# Patient Record
Sex: Male | Born: 1970 | Hispanic: No | Marital: Married | Smoking: Never smoker
Health system: Southern US, Community
[De-identification: ages and names within clinical notes are randomized; demographics above are authoritative.]

## PROBLEM LIST (undated history)

## (undated) DIAGNOSIS — I639 Cerebral infarction, unspecified: Secondary | ICD-10-CM

## (undated) DIAGNOSIS — Q2112 Patent foramen ovale: Secondary | ICD-10-CM

## (undated) DIAGNOSIS — D72829 Elevated white blood cell count, unspecified: Secondary | ICD-10-CM

## (undated) DIAGNOSIS — Q211 Atrial septal defect: Secondary | ICD-10-CM

## (undated) DIAGNOSIS — Z789 Other specified health status: Secondary | ICD-10-CM

## (undated) HISTORY — DX: Patent foramen ovale: Q21.12

## (undated) HISTORY — DX: Elevated white blood cell count, unspecified: D72.829

## (undated) HISTORY — PX: NO PAST SURGERIES: SHX2092

## (undated) HISTORY — DX: Cerebral infarction, unspecified: I63.9

## (undated) HISTORY — DX: Atrial septal defect: Q21.1

---

## 2015-08-13 ENCOUNTER — Inpatient Hospital Stay (HOSPITAL_COMMUNITY)
Admission: EM | Admit: 2015-08-13 | Discharge: 2015-08-16 | DRG: 065 | Disposition: A | Discharge status: Home / Self Care | Payer: BLUE CROSS/BLUE SHIELD | Attending: Internal Medicine | Admitting: Internal Medicine

## 2015-08-13 ENCOUNTER — Emergency Department (HOSPITAL_COMMUNITY): Payer: BLUE CROSS/BLUE SHIELD

## 2015-08-13 ENCOUNTER — Encounter (HOSPITAL_COMMUNITY): Payer: Self-pay | Admitting: Emergency Medicine

## 2015-08-13 DIAGNOSIS — I639 Cerebral infarction, unspecified: Secondary | ICD-10-CM | POA: Diagnosis not present

## 2015-08-13 DIAGNOSIS — I10 Essential (primary) hypertension: Secondary | ICD-10-CM | POA: Diagnosis present

## 2015-08-13 DIAGNOSIS — I6789 Other cerebrovascular disease: Secondary | ICD-10-CM | POA: Diagnosis not present

## 2015-08-13 DIAGNOSIS — I77810 Thoracic aortic ectasia: Secondary | ICD-10-CM | POA: Diagnosis present

## 2015-08-13 DIAGNOSIS — F1010 Alcohol abuse, uncomplicated: Secondary | ICD-10-CM | POA: Diagnosis present

## 2015-08-13 DIAGNOSIS — Q211 Atrial septal defect: Secondary | ICD-10-CM

## 2015-08-13 DIAGNOSIS — I63411 Cerebral infarction due to embolism of right middle cerebral artery: Secondary | ICD-10-CM | POA: Diagnosis not present

## 2015-08-13 DIAGNOSIS — E785 Hyperlipidemia, unspecified: Secondary | ICD-10-CM | POA: Diagnosis present

## 2015-08-13 DIAGNOSIS — D72829 Elevated white blood cell count, unspecified: Secondary | ICD-10-CM | POA: Diagnosis not present

## 2015-08-13 DIAGNOSIS — G8194 Hemiplegia, unspecified affecting left nondominant side: Secondary | ICD-10-CM | POA: Diagnosis present

## 2015-08-13 DIAGNOSIS — Q2112 Patent foramen ovale: Secondary | ICD-10-CM | POA: Insufficient documentation

## 2015-08-13 DIAGNOSIS — R531 Weakness: Secondary | ICD-10-CM | POA: Diagnosis present

## 2015-08-13 HISTORY — DX: Other specified health status: Z78.9

## 2015-08-13 LAB — I-STAT TROPONIN, ED: Troponin i, poc: 0.01 ng/mL (ref 0.00–0.08)

## 2015-08-13 LAB — COMPREHENSIVE METABOLIC PANEL
ALT: 19 U/L (ref 17–63)
ANION GAP: 9 (ref 5–15)
AST: 24 U/L (ref 15–41)
Albumin: 4.7 g/dL (ref 3.5–5.0)
Alkaline Phosphatase: 90 U/L (ref 38–126)
BILIRUBIN TOTAL: 0.9 mg/dL (ref 0.3–1.2)
BUN: 19 mg/dL (ref 6–20)
CHLORIDE: 105 mmol/L (ref 101–111)
CO2: 26 mmol/L (ref 22–32)
Calcium: 9.9 mg/dL (ref 8.9–10.3)
Creatinine, Ser: 1.02 mg/dL (ref 0.61–1.24)
Glucose, Bld: 116 mg/dL — ABNORMAL HIGH (ref 65–99)
POTASSIUM: 3.9 mmol/L (ref 3.5–5.1)
Sodium: 140 mmol/L (ref 135–145)
TOTAL PROTEIN: 8.3 g/dL — AB (ref 6.5–8.1)

## 2015-08-13 LAB — CBG MONITORING, ED: GLUCOSE-CAPILLARY: 95 mg/dL (ref 65–99)

## 2015-08-13 LAB — CBC WITH DIFFERENTIAL/PLATELET
BASOS ABS: 0 10*3/uL (ref 0.0–0.1)
Basophils Relative: 0 %
EOS PCT: 1 %
Eosinophils Absolute: 0.1 10*3/uL (ref 0.0–0.7)
HEMATOCRIT: 44.3 % (ref 39.0–52.0)
Hemoglobin: 15.3 g/dL (ref 13.0–17.0)
LYMPHS ABS: 1 10*3/uL (ref 0.7–4.0)
LYMPHS PCT: 8 %
MCH: 28.8 pg (ref 26.0–34.0)
MCHC: 34.5 g/dL (ref 30.0–36.0)
MCV: 83.4 fL (ref 78.0–100.0)
MONO ABS: 0.8 10*3/uL (ref 0.1–1.0)
MONOS PCT: 6 %
NEUTROS ABS: 10.2 10*3/uL — AB (ref 1.7–7.7)
Neutrophils Relative %: 85 %
PLATELETS: 265 10*3/uL (ref 150–400)
RBC: 5.31 MIL/uL (ref 4.22–5.81)
RDW: 13.3 % (ref 11.5–15.5)
WBC: 12 10*3/uL — ABNORMAL HIGH (ref 4.0–10.5)

## 2015-08-13 LAB — ETHANOL

## 2015-08-13 MED ORDER — ASPIRIN 325 MG PO TABS
325.0000 mg | ORAL_TABLET | Freq: Once | ORAL | Status: AC
  Administered 2015-08-13: 325 mg via ORAL
  Filled 2015-08-13: qty 1

## 2015-08-13 MED ORDER — ENOXAPARIN SODIUM 40 MG/0.4ML ~~LOC~~ SOLN
40.0000 mg | Freq: Every day | SUBCUTANEOUS | Status: DC
  Administered 2015-08-14 – 2015-08-15 (×2): 40 mg via SUBCUTANEOUS
  Filled 2015-08-13 (×2): qty 0.4

## 2015-08-13 MED ORDER — SODIUM CHLORIDE 0.9 % IV BOLUS (SEPSIS)
1000.0000 mL | Freq: Once | INTRAVENOUS | Status: AC
Start: 2015-08-13 — End: 2015-08-13
  Administered 2015-08-13: 1000 mL via INTRAVENOUS

## 2015-08-13 MED ORDER — SENNOSIDES-DOCUSATE SODIUM 8.6-50 MG PO TABS: 1.0000 | ORAL_TABLET | Freq: Every evening | ORAL | Status: DC | PRN

## 2015-08-13 MED ORDER — STROKE: EARLY STAGES OF RECOVERY BOOK
Freq: Once | Status: AC
  Administered 2015-08-15: 05:00:00

## 2015-08-13 MED ORDER — ALPRAZOLAM 0.5 MG PO TABS
0.5000 mg | ORAL_TABLET | Freq: Once | ORAL | Status: AC
  Administered 2015-08-13: 0.5 mg via ORAL
  Filled 2015-08-13: qty 1

## 2015-08-13 MED ORDER — ASPIRIN EC 81 MG PO TBEC
162.0000 mg | DELAYED_RELEASE_TABLET | Freq: Every day | ORAL | Status: DC
  Administered 2015-08-14 – 2015-08-15 (×2): 162 mg via ORAL
  Filled 2015-08-13 (×2): qty 2

## 2015-08-13 NOTE — ED Notes (Signed)
Pt's family arrived at bedside and pt became very anxious again after wife appeared anxious. Pt reminded to slow breathing.

## 2015-08-13 NOTE — ED Notes (Signed)
Pt to MRI

## 2015-08-13 NOTE — H&P (Signed)
[facility] History and Physical  Mayan Fillers [id number] DOB: [birth year] DOA: 08/13/2015  Referring physician: Shirlyn Goltz, M.D. PCP: Pcp Not In System   Chief Complaint: LUE weakness.  HPI: Aaron [name] is a 45 y.o. male  without any significant past medical history who comes to the emergency department due to above chief complaint since this morning.  Per patient, around 0830, he started noticing some numbness and weakness on his LUE, particularly on the distal forearm and hand associated with dizziness. He states that the symptoms were initially mild. He told told his wife about them, but symptoms seem to be better and she subsequently went to run some errands. The patient states that after that, he felt worsening of his weakness and numbness with intense dizziness and felt like he was about to pass out. So he called EMS and was subsequently brought to the emergency department. He states that since then the symptoms have been intermittent alternating with very mild to mild weakness of his distal LUE. He also states that he has been with increased stress lately and has been drinking 3-4 ounces of alcohol at night. Workup in the emergency department shows small acute CVA in the perirolandic area on MRI.   Review of Systems:  Constitutional:  No weight loss, night sweats, Fevers, chills, fatigue.  HEENT:  No headaches, Difficulty swallowing,Tooth/dental problems,Sore throat,  No sneezing, itching, ear ache, nasal congestion, post nasal drip,  Cardio-vascular:  No chest pain, Orthopnea, PND, swelling in lower extremities, anasarca, palpitations.  GI:  No heartburn, indigestion, abdominal pain, nausea, vomiting, diarrhea, change in bowel habits, loss of appetite  Resp:  No shortness of breath with exertion or at rest. No excess mucus, no productive cough, No non-productive cough, No coughing up of blood.No change in color of mucus.No wheezing.No chest wall deformity  Skin:  no rash  or lesions.  GU:  no dysuria, change in color of urine, no urgency or frequency. No flank pain.  Musculoskeletal:  No joint pain or swelling. No decreased range of motion. No back pain.  Psych:  Increased stress lately.  Neuro:  As above mentioned.   History reviewed. No pertinent past medical history. History reviewed. No pertinent past surgical history. Social History:  reports that he has never smoked. He does not have any smokeless tobacco history on file. He reports that he drinks alcohol. His drug history is not on file.  No Known Allergies  History reviewed. No pertinent family history.  Prior to Admission medications   Not on File   Physical Exam: Filed Vitals:   08/13/15 1359 08/13/15 1403 08/13/15 1824  BP:  121/89 98/66  Pulse:  70   Temp:  98.3 F (36.8 C)   TempSrc:  Oral   Resp:  16   SpO2: 97% 100%     Wt Readings from Last 3 Encounters:  No data found for Wt    General:  Mildly anxious. Eyes: PERRL, normal lids, irises & conjunctiva ENT: grossly normal hearing, lips & tongue Neck: no bruits, LAD, masses or thyromegaly Cardiovascular: RRR, no m/r/g. No LE edema. Telemetry: SR, no arrhythmias  Respiratory: CTA bilaterally, no w/r/r. Normal respiratory effort. Abdomen: soft, ntnd Skin: no rash or induration seen on limited exam Musculoskeletal: grossly normal tone BUE/BLE Psychiatric: grossly normal mood and affect, speech fluent and appropriate Neurologic: Awake, alert, oriented 4, mildly ecreased sensory and mild pronator drift on LUE. The patient had difficulty with nose to finger testing with LUE, Romberg test was  negative.           Labs on Admission:  Basic Metabolic Panel:  Recent Labs Lab 08/13/15 1540  NA 140  K 3.9  CL 105  CO2 26  GLUCOSE 116*  BUN 19  CREATININE 1.02  CALCIUM 9.9   Liver Function Tests:  Recent Labs Lab 08/13/15 1540  AST 24  ALT 19  ALKPHOS 90  BILITOT 0.9  PROT 8.3*  ALBUMIN 4.7    CBC:  Recent Labs Lab 08/13/15 1540  WBC 12.0*  NEUTROABS 10.2*  HGB 15.3  HCT 44.3  MCV 83.4  PLT 265    CBG:  Recent Labs Lab 08/13/15 1423  GLUCAP 95    Radiological Exams on Admission: Mr Brain Wo Contrast  08/13/2015  CLINICAL DATA:  45 year old male with left upper extremity weakness. Tingling in the fingers. Tachypnea. Dizziness. Initial encounter. EXAM: MRI HEAD WITHOUT CONTRAST TECHNIQUE: Multiplanar, multiecho pulse sequences of the brain and surrounding structures were obtained without intravenous contrast. COMPARISON:  None. FINDINGS: Major intracranial vascular flow voids are within normal limits. There is a small focus of cortical restricted diffusion in the right parietal lobe near the lateral peri-Rolandic cortex. There is more subtle cortically based diffusion signal abnormality in the right pre motor area near the left upper extremity motor representation (series 3, image 39). This is associated with subtle cortical FLAIR hyperintensity (series 7, image 20) with no associated hemorrhage or mass effect. No contralateral left hemisphere or posterior fossa restricted diffusion. Elsewhere gray and white matter signal is within normal limits for age throughout the brain. No midline shift, mass effect, evidence of mass lesion, ventriculomegaly, extra-axial collection or acute intracranial hemorrhage. Cervicomedullary junction and pituitary are within normal limits. Negative visualized cervical spine. Normal bone marrow signal. Visible internal auditory structures appear normal. Mastoids are clear. Right maxillary subtotal opacification which appears related to multiple mucous retention cysts. Small retention cyst in the left maxillary sinus. Mild ethmoid sinus mucosal thickening. Negative orbit and scalp soft tissues. IMPRESSION: 1. Small/subtle signal abnormality in the right peri-Rolandic cortex is most compatible with small acute cortical infarcts in the posterior right MCA  territory near the left upper extremity representation area. 2. No associated hemorrhage or mass effect. 3. Elsewhere normal noncontrast MRI appearance of the brain. Electronically Signed   By: Genevie Ann M.D.   On: 08/13/2015 17:32    EKG: Independently reviewed. Vent. rate 62 BPM PR interval 34 ms QRS duration 96 ms QT/QTc 413/419 ms P-R-T axes 44 51 10 Sinus rhythm Short PR interval No previous EKG to compare to.  Assessment/Plan Principal Problem:   Stroke [facility]) Admit to [facility]/inpatient. Frequent neuro checks. Continue aspirin. Check MRA to the brain. Check carotid Doppler. Check echocardiogram. Check fasting lipids and hemoglobin A1c in a.m.  Active Problems:   Leukocytosis Mild in no particular symptoms suggesting an infectious process. Follow-up CBC in a.m.     Alcohol abuse Per patient, he has been drinking one to 2 drinks of alcohol nightly in the past 2-3 weeks. No signs of withdrawal at this time. Start CiWA protocol in case of symptoms of DTs appear.    Neurology is following.   Code Status: Full code. DVT Prophylaxis: Lovenox SQ. Family Communication: His wife is present in the room. Disposition Plan: Admit to [facility] for further evaluation and neurology consult.   Time spent: Over 70 minutes were used in the process admission.   Reubin Milan, M.D. [facility] Pager [phone].

## 2015-08-13 NOTE — ED Notes (Addendum)
Pt denies dizziness at this time.Pt c/o immobility in L pointer finger. Concerned he has had a stroke. Pt exhibiting no other s/s of stroke.  Pt breathing heavily. Asked to slow down breathing.

## 2015-08-13 NOTE — ED Notes (Signed)
Per pt, he has no metal nor a pacemaker. Pt is able to sit for 45 min. Pt is not claustrophobic

## 2015-08-13 NOTE — ED Notes (Signed)
Per Ems, Pt from home, c/o dizziness when he made call to EMS. Pt has had increased stress recently and has been drinking more than usual. Pt has had a gradual increase in anxiety today. Pt c/o some dizziness. Per EMS, Pt was breathing rapidly, c/o tingling in fingers. Denies chest pain, N/V/D. A&Ox4 and ambulatory. 12 Lead showed NS.

## 2015-08-13 NOTE — ED Provider Notes (Signed)
CSN: [id number]     Arrival date & time 08/13/15  1351 History   First MD Initiated Contact with Patient 08/13/15 1507     Chief Complaint  Patient presents with  . Dizziness  . Anxiety     (Consider location/radiation/quality/duration/timing/severity/associated sxs/prior Treatment) The history is provided by the patient.  Aaron [name] is a 45 y.o. male otherwise healthy here presenting with left arm numbness and weakness. He states that he was eating this morning and around 8:30 AM noticed some heaviness in the left thumb and second finger. Also has some intermittent numbness in that area as well. Denies any fall or injury or history of carpal tunnel or neck pain. Also has some left facial numbness that resolved. No hx of stroke previously. Also felt like passing out but didn't actually pass out.        History reviewed. No pertinent past medical history. History reviewed. No pertinent past surgical history. No family history on file. Social History  Substance Use Topics  . Smoking status: Never Smoker   . Smokeless tobacco: None  . Alcohol Use: Yes    Review of Systems  Neurological: Positive for dizziness and numbness.  All other systems reviewed and are negative.     Allergies  Review of patient's allergies indicates no known allergies.  Home Medications   Prior to Admission medications   Not on File   BP 98/66 mmHg  Pulse 70  Temp(Src) 98.3 F (36.8 C) (Oral)  Resp 16  SpO2 100% Physical Exam  Constitutional: He is oriented to person, place, and time.  Slightly anxious   HENT:  Head: Normocephalic.  Mouth/Throat: Oropharynx is clear and moist.  Eyes: Conjunctivae are normal. Pupils are equal, round, and reactive to light.  Neck: Normal range of motion. Neck supple.  Cardiovascular: Normal rate, regular rhythm and normal heart sounds.   Pulmonary/Chest: Effort normal and breath sounds normal. No respiratory distress. He has no wheezes. He has no rales.   Abdominal: Soft. Bowel sounds are normal. He exhibits no distension. There is no tenderness. There is no rebound.  Musculoskeletal: Normal range of motion.  Neurological: He is alert and oriented to person, place, and time.  CN 2-12 intact. Dec sensation web space between thumb and second finger, no tenderness or tingling tapping on the wrist, dec L thumb apposition. Nl strength otherwise.   Skin: Skin is warm and dry.  Psychiatric: He has a normal mood and affect. His behavior is normal. Judgment normal.  Nursing note and vitals reviewed.   ED Course  Procedures (including critical care time) Labs Review Labs Reviewed  CBC WITH DIFFERENTIAL/PLATELET - Abnormal; Notable for the following:    WBC 12.0 (*)    Neutro Abs 10.2 (*)    All other components within normal limits  COMPREHENSIVE METABOLIC PANEL - Abnormal; Notable for the following:    Glucose, Bld 116 (*)    Total Protein 8.3 (*)    All other components within normal limits  ETHANOL  CBG MONITORING, ED  Randolm Idol, ED    Imaging Review Mr Brain Wo Contrast  08/13/2015  CLINICAL DATA:  45 year old male with left upper extremity weakness. Tingling in the fingers. Tachypnea. Dizziness. Initial encounter. EXAM: MRI HEAD WITHOUT CONTRAST TECHNIQUE: Multiplanar, multiecho pulse sequences of the brain and surrounding structures were obtained without intravenous contrast. COMPARISON:  None. FINDINGS: Major intracranial vascular flow voids are within normal limits. There is a small focus of cortical restricted diffusion in the right parietal  lobe near the lateral peri-Rolandic cortex. There is more subtle cortically based diffusion signal abnormality in the right pre motor area near the left upper extremity motor representation (series 3, image 39). This is associated with subtle cortical FLAIR hyperintensity (series 7, image 20) with no associated hemorrhage or mass effect. No contralateral left hemisphere or posterior fossa  restricted diffusion. Elsewhere gray and white matter signal is within normal limits for age throughout the brain. No midline shift, mass effect, evidence of mass lesion, ventriculomegaly, extra-axial collection or acute intracranial hemorrhage. Cervicomedullary junction and pituitary are within normal limits. Negative visualized cervical spine. Normal bone marrow signal. Visible internal auditory structures appear normal. Mastoids are clear. Right maxillary subtotal opacification which appears related to multiple mucous retention cysts. Small retention cyst in the left maxillary sinus. Mild ethmoid sinus mucosal thickening. Negative orbit and scalp soft tissues. IMPRESSION: 1. 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 2. No associated hemorrhage or mass effect. 3. Elsewhere normal noncontrast MRI appearance of the brain. Electronically Signed   By: Genevie Ann M.D.   On: 08/13/2015 17:32   I have personally reviewed and evaluated these images and lab results as part of my medical decision-making.   EKG Interpretation   Date/Time:  Saturday August 13 2015 14:08:43 EDT Ventricular Rate:  62 PR Interval:  34 QRS Duration: 96 QT Interval:  413 QTC Calculation: 419 R Axis:   51 Text Interpretation:  Sinus rhythm Short PR interval No previous ECGs  available Confirmed by YAO  MD, DAVID ([id number]) on 08/13/2015 3:49:11 PM      MDM   Final diagnoses:  None    Aaron [name] is a 45 y.o. male here with L arm numbness. Consider anxiety vs TIA. Outside room for TPA and has minimal deficits. Will get MRI brain, labs.   6:40 pm MRI showed R peri Rolandic cortex infarct. Consulted Dr. Cheral Marker, neurologist, who recommend admission and transfer to [facility] for stroke workup.   Wandra Arthurs, MD 08/13/15 [phone]

## 2015-08-14 ENCOUNTER — Inpatient Hospital Stay (HOSPITAL_COMMUNITY): Payer: BLUE CROSS/BLUE SHIELD

## 2015-08-14 DIAGNOSIS — F1010 Alcohol abuse, uncomplicated: Secondary | ICD-10-CM | POA: Insufficient documentation

## 2015-08-14 DIAGNOSIS — I63411 Cerebral infarction due to embolism of right middle cerebral artery: Secondary | ICD-10-CM

## 2015-08-14 DIAGNOSIS — D72829 Elevated white blood cell count, unspecified: Secondary | ICD-10-CM

## 2015-08-14 LAB — CBC WITH DIFFERENTIAL/PLATELET
BASOS ABS: 0 10*3/uL (ref 0.0–0.1)
BASOS PCT: 0 %
EOS ABS: 0.2 10*3/uL (ref 0.0–0.7)
EOS PCT: 3 %
HCT: 42.1 % (ref 39.0–52.0)
Hemoglobin: 13.7 g/dL (ref 13.0–17.0)
Lymphocytes Relative: 25 %
Lymphs Abs: 1.7 10*3/uL (ref 0.7–4.0)
MCH: 28 pg (ref 26.0–34.0)
MCHC: 32.5 g/dL (ref 30.0–36.0)
MCV: 86.1 fL (ref 78.0–100.0)
MONO ABS: 0.4 10*3/uL (ref 0.1–1.0)
MONOS PCT: 6 %
NEUTROS ABS: 4.5 10*3/uL (ref 1.7–7.7)
Neutrophils Relative %: 66 %
PLATELETS: 219 10*3/uL (ref 150–400)
RBC: 4.89 MIL/uL (ref 4.22–5.81)
RDW: 13.6 % (ref 11.5–15.5)
WBC: 6.8 10*3/uL (ref 4.0–10.5)

## 2015-08-14 LAB — LIPID PANEL
CHOLESTEROL: 145 mg/dL (ref 0–200)
HDL: 51 mg/dL (ref >40)
LDL Cholesterol: 83 mg/dL (ref 0–99)
TRIGLYCERIDES: 55 mg/dL (ref <150)
Total CHOL/HDL Ratio: 2.8 RATIO
VLDL: 11 mg/dL (ref 0–40)

## 2015-08-14 LAB — COMPREHENSIVE METABOLIC PANEL
ALT: 17 U/L (ref 17–63)
AST: 20 U/L (ref 15–41)
Albumin: 3.6 g/dL (ref 3.5–5.0)
Alkaline Phosphatase: 73 U/L (ref 38–126)
Anion gap: 7 (ref 5–15)
BILIRUBIN TOTAL: 1 mg/dL (ref 0.3–1.2)
BUN: 14 mg/dL (ref 6–20)
CO2: 24 mmol/L (ref 22–32)
CREATININE: 1.16 mg/dL (ref 0.61–1.24)
Calcium: 9.4 mg/dL (ref 8.9–10.3)
Chloride: 109 mmol/L (ref 101–111)
Glucose, Bld: 109 mg/dL — ABNORMAL HIGH (ref 65–99)
Potassium: 3.9 mmol/L (ref 3.5–5.1)
Sodium: 140 mmol/L (ref 135–145)
TOTAL PROTEIN: 6.6 g/dL (ref 6.5–8.1)

## 2015-08-14 LAB — ANTITHROMBIN III: AntiThromb III Func: 119 % (ref 75–120)

## 2015-08-14 MED ORDER — MAGNESIUM SULFATE 2 GM/50ML IV SOLN
2.0000 g | Freq: Once | INTRAVENOUS | Status: AC
  Administered 2015-08-14: 2 g via INTRAVENOUS
  Filled 2015-08-14: qty 50

## 2015-08-14 MED ORDER — VITAMIN B-1 100 MG PO TABS
100.0000 mg | ORAL_TABLET | Freq: Every day | ORAL | Status: DC
  Administered 2015-08-14 – 2015-08-16 (×3): 100 mg via ORAL
  Filled 2015-08-14 (×3): qty 1

## 2015-08-14 MED ORDER — SODIUM CHLORIDE 0.9 % IV SOLN
INTRAVENOUS | Status: DC
  Administered 2015-08-14: 08:00:00 via INTRAVENOUS

## 2015-08-14 MED ORDER — LORAZEPAM 1 MG PO TABS: 0.0000 mg | ORAL_TABLET | Freq: Four times a day (QID) | ORAL | Status: AC

## 2015-08-14 MED ORDER — FOLIC ACID 1 MG PO TABS
1.0000 mg | ORAL_TABLET | Freq: Every day | ORAL | Status: DC
  Administered 2015-08-14 – 2015-08-16 (×3): 1 mg via ORAL
  Filled 2015-08-14 (×3): qty 1

## 2015-08-14 MED ORDER — LORAZEPAM 1 MG PO TABS: 0.0000 mg | ORAL_TABLET | Freq: Two times a day (BID) | ORAL | Status: DC

## 2015-08-14 MED ORDER — ADULT MULTIVITAMIN W/MINERALS CH
1.0000 | ORAL_TABLET | Freq: Every day | ORAL | Status: DC
  Administered 2015-08-14 – 2015-08-16 (×3): 1 via ORAL
  Filled 2015-08-14 (×3): qty 1

## 2015-08-14 MED ORDER — THIAMINE HCL 100 MG/ML IJ SOLN: 100.0000 mg | Freq: Every day | INTRAMUSCULAR | Status: DC

## 2015-08-14 MED ORDER — ATORVASTATIN CALCIUM 10 MG PO TABS
10.0000 mg | ORAL_TABLET | Freq: Every day | ORAL | Status: DC
  Administered 2015-08-14 – 2015-08-16 (×3): 10 mg via ORAL
  Filled 2015-08-14 (×3): qty 1

## 2015-08-14 MED ORDER — LORAZEPAM 1 MG PO TABS: 1.0000 mg | ORAL_TABLET | Freq: Four times a day (QID) | ORAL | Status: DC | PRN

## 2015-08-14 MED ORDER — LORAZEPAM 2 MG/ML IJ SOLN: 1.0000 mg | Freq: Four times a day (QID) | INTRAMUSCULAR | Status: DC | PRN

## 2015-08-14 NOTE — Progress Notes (Signed)
VASCULAR LAB PRELIMINARY  PRELIMINARY  PRELIMINARY  PRELIMINARY  Carotid duplex completed.    Preliminary report:  1-39% ICA plaquing.  Vertebral artery flow is antegrade.   Royelle Hinchman, RVT 08/14/2015, 10:04 AM

## 2015-08-14 NOTE — Progress Notes (Addendum)
[facility] PROGRESS NOTE  Aaron [name] [id number] DOB: 12-12-1970 DOA: 08/13/2015 PCP: Pcp Not In System  Brief Narrative 45 y/o that presented with Acute CVA. Neurology on board and patient undergoing stroke work up.  Assessment/Plan: Principal Problem:   Stroke [facility]) - Neuro consulted - stroke work up underway  Active Problems:   Leukocytosis - resolved off antibiotics, no fevers    Alcohol abuse Addendum: Do not suspect alcohol abuse and agree with removing this from listed problem list. - prn ativan on board. Pt shows no signs of withdrawal on physical exam  Code Status: full Family Communication: d/c pt directly Disposition Plan: pending results of work up   Consultants:  Neurology  Procedures:  None  Antibiotics:  None  HPI/Subjective: Pt has no new complaints. No acute issues reported overnight.  Objective: Filed Vitals:   08/14/15 0800 08/14/15 1000  BP: 104/84 112/76  Pulse: 58 58  Temp: 97.9 F (36.6 C)   Resp: 15 16   No intake or output data in the 24 hours ending 08/14/15 1313 Filed Weights   08/13/15 2245  Weight: 91.627 kg (202 lb)    Exam:   General:  Pt in nad, alert and awake  Cardiovascular: no cyanosis or edema  Respiratory: no increased wob, no wheezes  Abdomen: nd, no guarding  Musculoskeletal: no clubbing   Data Reviewed: Basic Metabolic Panel:  Recent Labs Lab 08/13/15 1540 08/14/15 0842  NA 140 140  K 3.9 3.9  CL 105 109  CO2 26 24  GLUCOSE 116* 109*  BUN 19 14  CREATININE 1.02 1.16  CALCIUM 9.9 9.4   Liver Function Tests:  Recent Labs Lab 08/13/15 1540 08/14/15 0842  AST 24 20  ALT 19 17  ALKPHOS 90 73  BILITOT 0.9 1.0  PROT 8.3* 6.6  ALBUMIN 4.7 3.6   No results for input(s): LIPASE, AMYLASE in the last 168 hours. No results for input(s): AMMONIA in the last 168 hours. CBC:  Recent Labs Lab 08/13/15 1540 08/14/15 0842  WBC 12.0* 6.8  NEUTROABS 10.2* 4.5  HGB 15.3 13.7  HCT  44.3 42.1  MCV 83.4 86.1  PLT 265 219   Cardiac Enzymes: No results for input(s): CKTOTAL, CKMB, CKMBINDEX, TROPONINI in the last 168 hours. BNP (last 3 results) No results for input(s): BNP in the last 8760 hours.  ProBNP (last 3 results) No results for input(s): PROBNP in the last 8760 hours.  CBG:  Recent Labs Lab 08/13/15 1423  GLUCAP 95    No results found for this or any previous visit (from the past 240 hour(s)).   Studies: Dg Chest 2 View  08/14/2015  CLINICAL DATA:  Strokes tonight.  Nonsmoker. EXAM: CHEST  2 VIEW COMPARISON:  None. FINDINGS: The heart size and mediastinal contours are within normal limits. Both lungs are clear. The visualized skeletal structures are unremarkable. IMPRESSION: No active cardiopulmonary disease. Electronically Signed   By: Lucienne Capers M.D.   On: 08/14/2015 02:11   Ct Head Wo Contrast  08/14/2015  CLINICAL DATA:  45 year old male with stroke EXAM: CT HEAD WITHOUT CONTRAST TECHNIQUE: Contiguous axial images were obtained from the base of the skull through the vertex without intravenous contrast. COMPARISON:  Brain MRI dated 08/13/2015 FINDINGS: The ventricles and the sulci are appropriate in size for the patient's age. There is no intracranial hemorrhage. No midline shift or mass effect identified. The gray-white matter differentiation is preserved. Small right MCA territory infarcts seen on the prior MRI are not detected  on the CT. Right maxillary sinus retention cyst or polyp noted. There is minimal mucoperiosteal thickening of the paranasal sinuses. No air-fluid levels. The mastoid air cells are clear. The calvarium is intact. IMPRESSION: No acute intracranial hemorrhage. Electronically Signed   By: Anner Crete M.D.   On: 08/14/2015 02:17   Mr Brain Wo Contrast  08/13/2015  CLINICAL DATA:  45 year old male with left upper extremity weakness. Tingling in the fingers. Tachypnea. Dizziness. Initial encounter. EXAM: MRI HEAD WITHOUT  CONTRAST TECHNIQUE: Multiplanar, multiecho pulse sequences of the brain and surrounding structures were obtained without intravenous contrast. COMPARISON:  None. FINDINGS: Major intracranial vascular flow voids are within normal limits. There is a small focus of cortical restricted diffusion in the right parietal lobe near the lateral peri-Rolandic cortex. There is more subtle cortically based diffusion signal abnormality in the right pre motor area near the left upper extremity motor representation (series 3, image 39). This is associated with subtle cortical FLAIR hyperintensity (series 7, image 20) with no associated hemorrhage or mass effect. No contralateral left hemisphere or posterior fossa restricted diffusion. Elsewhere gray and white matter signal is within normal limits for age throughout the brain. No midline shift, mass effect, evidence of mass lesion, ventriculomegaly, extra-axial collection or acute intracranial hemorrhage. Cervicomedullary junction and pituitary are within normal limits. Negative visualized cervical spine. Normal bone marrow signal. Visible internal auditory structures appear normal. Mastoids are clear. Right maxillary subtotal opacification which appears related to multiple mucous retention cysts. Small retention cyst in the left maxillary sinus. Mild ethmoid sinus mucosal thickening. Negative orbit and scalp soft tissues. IMPRESSION: 1. 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 2. No associated hemorrhage or mass effect. 3. Elsewhere normal noncontrast MRI appearance of the brain. Electronically Signed   By: Genevie Ann M.D.   On: 08/13/2015 17:32   Mr Jodene Nam Head/brain Wo Cm  08/14/2015  CLINICAL DATA:  Numbness and weakness LEFT upper extremity, follow-up stroke. EXAM: MRA HEAD WITHOUT CONTRAST TECHNIQUE: Angiographic  images of the Circle of Willis were obtained using MRA technique without intravenous contrast. COMPARISON:  MRI of the brain August 13, 2015 FINDINGS: Anterior circulation: Normal flow related enhancement of the included cervical, petrous, cavernous and supraclinoid internal carotid arteries. Patent anterior communicating artery. Normal flow related enhancement of the anterior and middle cerebral arteries, including distal segments. Supernumerary anterior cerebral artery arising from RIGHT A1-2 junction. No large vessel occlusion, high-grade stenosis, abnormal luminal irregularity, aneurysm. Posterior circulation: Codominant vertebral artery's. Basilar artery is patent, with normal flow related enhancement of the main branch vessels. Small RIGHT posterior communicating artery present. Normal flow related enhancement of the posterior cerebral arteries. No large vessel occlusion, high-grade stenosis, abnormal luminal irregularity, aneurysm. IMPRESSION: Negative MRA head. Electronically Signed   By: Elon Alas M.D.   On: 08/14/2015 02:53    Scheduled Meds: .  stroke: mapping our early stages of recovery book   Does not apply Once  . aspirin EC  162 mg Oral Daily  . enoxaparin (LOVENOX) injection  40 mg Subcutaneous QHS  . folic acid  1 mg Oral Daily  . LORazepam  0-4 mg Oral Q6H   Followed by  . [START ON 08/16/2015] LORazepam  0-4 mg Oral Q12H  . multivitamin with minerals  1 tablet Oral Daily  . thiamine  100 mg Oral Daily   Continuous Infusions: . sodium  chloride 10 mL/hr at 08/14/15 0826    Time spent: > 25 minutes    Velvet Bathe  [facility] Pager [phone] If 7PM-7AM, please contact night-coverage at [email or web address], password [facility] 08/14/2015, 1:13 PM  LOS: 1 day

## 2015-08-14 NOTE — Progress Notes (Signed)
Pt given supplies to take shower. Alerted tele pt will be off monitor for a few minutes.

## 2015-08-14 NOTE — Progress Notes (Signed)
STROKE TEAM PROGRESS NOTE   HISTORY OF PRESENT ILLNESS Aaron [name] is a 45 y.o. male with no known medical disorder and on no medications, presenting with acute onset of weakness and numbness involving her hand and forearm starting at about 8:30 AM on 08/13/2015. He has no previous history of stroke nor TIA. He has not been on antiplatelet therapy. There was no facial weakness no facial droop. There was no left lower extremity weakness no numbness. MRI of his brain showed multiple small areas of ischemic infarction involving right MCA territory.  LSN: 8:30 AM on 08/13/2015 tPA Given: No: Minimal deficits mRankin:   SUBJECTIVE (INTERVAL HISTORY) His wife was at the bedside.  Overall he feels his condition is gradually improving. He reports continued lack of coordination in the left hand and states he is worried this will affect his ability to do his job   OBJECTIVE Temp:  [97.8 F (36.6 C)-98.2 F (36.8 C)] 98 F (36.7 C) (03/26 1418) Pulse Rate:  [54-64] 58 (03/26 1418) Cardiac Rhythm:  [-] Normal sinus rhythm (03/26 0700) Resp:  [15-20] 20 (03/26 1418) BP: (95-116)/(57-84) 109/62 mmHg (03/26 1418) SpO2:  [97 %-100 %] 98 % (03/26 1418) Weight:  [91.627 kg (202 lb)] 91.627 kg (202 lb) (03/25 2245)  CBC:  Recent Labs Lab 08/13/15 1540 08/14/15 0842  WBC 12.0* 6.8  NEUTROABS 10.2* 4.5  HGB 15.3 13.7  HCT 44.3 42.1  MCV 83.4 86.1  PLT 265 [id number]    Basic Metabolic Panel:  Recent Labs Lab 08/13/15 1540 08/14/15 0842  NA 140 140  K 3.9 3.9  CL 105 109  CO2 26 24  GLUCOSE 116* 109*  BUN 19 14  CREATININE 1.02 1.16  CALCIUM 9.9 9.4    Lipid Panel:    Component Value Date/Time   CHOL 145 08/14/2015 0842   TRIG 55 08/14/2015 0842   HDL 51 08/14/2015 0842   CHOLHDL 2.8 08/14/2015 0842   VLDL 11 08/14/2015 0842   LDLCALC 83 08/14/2015 0842   HgbA1c: No results found for: HGBA1C Urine Drug Screen: No results found for: LABOPIA, COCAINSCRNUR, LABBENZ, AMPHETMU, THCU,  LABBARB    IMAGING  Dg Chest 2 View 08/14/2015    No active cardiopulmonary disease.   Ct Head Wo Contrast 08/14/2015   No acute intracranial hemorrhage.   Mr Brain Wo Contrast 08/13/2015   1. 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  2. No associated hemorrhage or mass effect.  3. Elsewhere normal noncontrast MRI appearance of the brain.    Mr Jodene Nam Head/brain Wo Cm 08/14/2015   Negative MRA head.    PHYSICAL EXAM Physical Exam General - Well nourished, well developed, in NAD   Cardiovascular - Regular rate and rhythm Pulmonary: CTA Abdomen: NT, ND, normal bowel sounds Extremities: No C/C/E  Neurological Exam Mental Status: Normal Orientation:  Oriented to person, place and time Speech:  Fluent; no dysarthria   Cranial Nerves:  PERRL; EOMI; visual fields full, face grossly symmetric, hearing grossly intact; shrug symmetric and tongue midline  Motor Exam:  Tone:  Within normal limits; Strength: 5/5 throughout except the left grip is weaker  Sensory: Intact to light touch throughout  Coordination:  Intact finger to nose; RAM is impaired in the left hand  Gait: Deferred     ASSESSMENT/PLAN [name] is a 45 y.o. male with no significant past  medical history  presenting with acute onset of weakness and numbness involving her left hand and forearm. He did not receive IV t-PA due to minimal deficits.  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  Resultant  Left hand weakness  MRI  small acute cortical infarcts in the posterior right MCA territory  MRA  negative  Carotid Doppler 1-39% ICA plaquing. Vertebral artery flow is antegrade.   2D Echo  pending  LDL 83  HgbA1c pending  VTE prophylaxis - Lovenox  Diet regular Room service appropriate?: Yes;  Fluid consistency:: Thin  No antithrombotic prior to admission, now on aspirin 325 mg daily  Patient counseled to be compliant with his antithrombotic medications  Ongoing aggressive stroke risk factor management  Therapy recommendations: No follow-up physical therapy recommended.  Disposition:  Will be determined after work-uo   Hypertension  Blood pressure tends to run low  Permissive hypertension (OK if < 220/120) but gradually normalize in 5-7 days  Hyperlipidemia  Home meds: No lipid lowering medications prior to admission  LDL 83, goal < 70  Start Lipitor 10 mg daily  Continue statin at discharge  Other Stroke Risk Factors  ETOH use   Other Active Problems  Check hypercoagulable panel per Dr. Angelica Ran day # Stollings PA-C [facility] Pager [phone] 08/14/2015, 4:49 PM  ATTENDING NOTE: Patient was seen and examined by me personally. Documentation reflects findings. The laboratory and radiographic studies reviewed by me. ROS completed by me personally and pertinent positives fully documented  Condition: Stable  Assessment and plan completed by me personally and fully documented above. Plans/Recommendations include:     Stroke in young work up ordered  Stroke work-up ongling  OT for hand weakness  SIGNED BY: Dr. Elissa Hefty     To contact Stroke Continuity provider, please refer to [email or web address] After hours, contact General Neurology

## 2015-08-14 NOTE — Consult Note (Signed)
Admission H&P    Chief Complaint: Acute onset of weakness and numbness involving distal left upper extremity.  HPI: Aaron [name] is an 45 y.o. male with no known medical disorder and on no medications, presenting with acute onset of weakness and numbness involving her hand and forearm starting at about 8:30 AM on 08/13/2015. He has no previous history of stroke nor TIA. He has not been on antiplatelet therapy. There was no facial weakness no facial droop. There was no left lower extremity weakness no numbness. MRI of his brain showed multiple small areas of ischemic infarction involving right MCA territory.  LSN: 8:30 AM on 08/13/2015 tPA Given: No: Minimal deficits mRankin:  History reviewed. No pertinent past medical history.  History reviewed. No pertinent past surgical history.  History reviewed. No pertinent family history. Social History:  reports that he has never smoked. He does not have any smokeless tobacco history on file. He reports that he drinks alcohol. His drug history is not on file.  Allergies: No Known Allergies  No prescriptions prior to admission    ROS: History obtained from the patient  General ROS: negative for - chills, fatigue, fever, night sweats, weight gain or weight loss Psychological ROS: negative for - behavioral disorder, hallucinations, memory difficulties, mood swings or suicidal ideation Ophthalmic ROS: negative for - blurry vision, double vision, eye pain or loss of vision ENT ROS: negative for - epistaxis, nasal discharge, oral lesions, sore throat, tinnitus or vertigo Allergy and Immunology ROS: negative for - hives or itchy/watery eyes Hematological and Lymphatic ROS: negative for - bleeding problems, bruising or swollen lymph nodes Endocrine ROS: negative for - galactorrhea, hair pattern changes, polydipsia/polyuria or temperature intolerance Respiratory ROS: negative for - cough, hemoptysis, shortness of breath or wheezing Cardiovascular ROS:  negative for - chest pain, dyspnea on exertion, edema or irregular heartbeat Gastrointestinal ROS: negative for - abdominal pain, diarrhea, hematemesis, nausea/vomiting or stool incontinence Genito-Urinary ROS: negative for - dysuria, hematuria, incontinence or urinary frequency/urgency Musculoskeletal ROS: negative for - joint swelling or muscular weakness Neurological ROS: as noted in HPI Dermatological ROS: negative for rash and skin lesion changes  Physical Examination: Blood pressure 115/68, pulse 57, temperature 97.8 F (36.6 C), temperature source Oral, resp. rate 16, height '5\' 11"'$  (1.803 m), weight 91.627 kg (202 lb), SpO2 98 %.  HEENT-  Normocephalic, no lesions, without obvious abnormality.  Normal external eye and conjunctiva.  Normal TM's bilaterally.  Normal auditory canals and external ears. Normal external nose, mucus membranes and septum.  Normal pharynx. Neck supple with no masses, nodes, nodules or enlargement. Cardiovascular - regular rate and rhythm, S1, S2 normal, no murmur, click, rub or gallop Lungs - chest clear, no wheezing, rales, normal symmetric air entry Abdomen - soft, non-tender; bowel sounds normal; no masses,  no organomegaly Extremities - no joint deformities, effusion, or inflammation and no edema  Neurologic Examination: Mental Status: Alert, oriented, thought content appropriate.  Speech fluent without evidence of aphasia. Able to follow commands without difficulty. Cranial Nerves: II-Visual fields were normal. III/IV/VI-Pupils were equal and reacted normally to light. Extraocular movements were full and conjugate.    V/VII-no facial numbness and no facial weakness. VIII-normal. X-normal speech and symmetrical palatal movement. XI: trapezius strength/neck flexion strength normal bilaterally XII-midline tongue extension with normal strength. Motor: 5/5 bilaterally with normal tone and bulk Sensory: Normal throughout. Deep Tendon Reflexes: 2+ and  symmetric. Plantars: Mute bilaterally Cerebellar: Normal finger-to-nose testing. Carotid auscultation: Normal  Results for orders placed or performed  during the hospital encounter of 08/13/15 (from the past 48 hour(s))  CBG monitoring, ED     Status: None   Collection Time: 08/13/15  2:23 PM  Result Value Ref Range   Glucose-Capillary 95 65 - 99 mg/dL  CBC with Differential     Status: Abnormal   Collection Time: 08/13/15  3:40 PM  Result Value Ref Range   WBC 12.0 (H) 4.0 - 10.5 K/uL   RBC 5.31 4.22 - 5.81 MIL/uL   Hemoglobin 15.3 13.0 - 17.0 g/dL   HCT 44.3 39.0 - 52.0 %   MCV 83.4 78.0 - 100.0 fL   MCH 28.8 26.0 - 34.0 pg   MCHC 34.5 30.0 - 36.0 g/dL   RDW 13.3 11.5 - 15.5 %   Platelets 265 150 - 400 K/uL   Neutrophils Relative % 85 %   Neutro Abs 10.2 (H) 1.7 - 7.7 K/uL   Lymphocytes Relative 8 %   Lymphs Abs 1.0 0.7 - 4.0 K/uL   Monocytes Relative 6 %   Monocytes Absolute 0.8 0.1 - 1.0 K/uL   Eosinophils Relative 1 %   Eosinophils Absolute 0.1 0.0 - 0.7 K/uL   Basophils Relative 0 %   Basophils Absolute 0.0 0.0 - 0.1 K/uL  Comprehensive metabolic panel     Status: Abnormal   Collection Time: 08/13/15  3:40 PM  Result Value Ref Range   Sodium 140 135 - 145 mmol/L   Potassium 3.9 3.5 - 5.1 mmol/L   Chloride 105 101 - 111 mmol/L   CO2 26 22 - 32 mmol/L   Glucose, Bld 116 (H) 65 - 99 mg/dL   BUN 19 6 - 20 mg/dL   Creatinine, Ser 1.02 0.61 - 1.24 mg/dL   Calcium 9.9 8.9 - 10.3 mg/dL   Total Protein 8.3 (H) 6.5 - 8.1 g/dL   Albumin 4.7 3.5 - 5.0 g/dL   AST 24 15 - 41 U/L   ALT 19 17 - 63 U/L   Alkaline Phosphatase 90 38 - 126 U/L   Total Bilirubin 0.9 0.3 - 1.2 mg/dL   GFR calc non Af Amer >60 >60 mL/min   GFR calc Af Amer >60 >60 mL/min    Comment: (NOTE) The eGFR has been calculated using the CKD EPI equation. This calculation has not been validated in all clinical situations. eGFR's persistently <60 mL/min signify possible Chronic Kidney Disease.    Anion  gap 9 5 - 15  I-stat troponin, ED     Status: None   Collection Time: 08/13/15  3:44 PM  Result Value Ref Range   Troponin i, poc 0.01 0.00 - 0.08 ng/mL   Comment 3            Comment: Due to the release kinetics of cTnI, a negative result within the first hours of the onset of symptoms does not rule out myocardial infarction with certainty. If myocardial infarction is still suspected, repeat the test at appropriate intervals.   Ethanol     Status: None   Collection Time: 08/13/15  4:00 PM  Result Value Ref Range   Alcohol, Ethyl (B) <5 <5 mg/dL    Comment:        LOWEST DETECTABLE LIMIT FOR SERUM ALCOHOL IS 5 mg/dL FOR MEDICAL PURPOSES ONLY    Mr Brain Wo Contrast  08/13/2015  CLINICAL DATA:  45 year old male with left upper extremity weakness. Tingling in the fingers. Tachypnea. Dizziness. Initial encounter. EXAM: MRI HEAD WITHOUT CONTRAST TECHNIQUE: Multiplanar, multiecho pulse sequences  of the brain and surrounding structures were obtained without intravenous contrast. COMPARISON:  None. FINDINGS: Major intracranial vascular flow voids are within normal limits. There is a small focus of cortical restricted diffusion in the right parietal lobe near the lateral peri-Rolandic cortex. There is more subtle cortically based diffusion signal abnormality in the right pre motor area near the left upper extremity motor representation (series 3, image 39). This is associated with subtle cortical FLAIR hyperintensity (series 7, image 20) with no associated hemorrhage or mass effect. No contralateral left hemisphere or posterior fossa restricted diffusion. Elsewhere gray and white matter signal is within normal limits for age throughout the brain. No midline shift, mass effect, evidence of mass lesion, ventriculomegaly, extra-axial collection or acute intracranial hemorrhage. Cervicomedullary junction and pituitary are within normal limits. Negative visualized cervical spine. Normal bone marrow  signal. Visible internal auditory structures appear normal. Mastoids are clear. Right maxillary subtotal opacification which appears related to multiple mucous retention cysts. Small retention cyst in the left maxillary sinus. Mild ethmoid sinus mucosal thickening. Negative orbit and scalp soft tissues. IMPRESSION: 1. 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 2. No associated hemorrhage or mass effect. 3. Elsewhere normal noncontrast MRI appearance of the brain. Electronically Signed   By: Genevie Ann M.D.   On: 08/13/2015 17:32    Assessment: 45 y.o. male with no known risk factors for stroke presenting with acute right MCA territory infarctions, likely embolic of arterial source.  Stroke Risk Factors - none  Plan: 1. HgbA1c, fasting lipid panel 2. MRA  of the brain without contrast 3. PT consult, OT consult, Speech consult 4. Echocardiogram 5. Carotid dopplers 6. Prophylactic therapy-Antiplatelet med: Aspirin  7. Risk factor modification 8. Telemetry monitoring 9. Hypercoagulopathy panel  C.R. Nicole Kindred, MD [facility] [phone]  08/14/2015, 12:16 AM

## 2015-08-14 NOTE — Evaluation (Signed)
Physical Therapy Evaluation Patient Details Name: Aaron [name] MRN: [id number] DOB: Oct 16, 1970 Today's Date: 08/14/2015   History of Present Illness  Patient is a 45 yo male admitted 08/13/15 with LUE weakness and numbness.  MRI showed Rt MCA infarct.   PMH:  None relevant.  Clinical Impression  Patient is functioning at independent level with mobility and gait.  Good balance with gait and high level balance activities.  No acute PT needs identified - PT will sign off.  Patient concerned with LUE/hand decreased strength and coordination.  OT to assess.    Follow Up Recommendations No PT follow up    Equipment Recommendations  None recommended by PT    Recommendations for Other Services OT consult     Precautions / Restrictions Precautions Precautions: None Restrictions Weight Bearing Restrictions: No      Mobility  Bed Mobility Overal bed mobility: Independent                Transfers Overall transfer level: Independent Equipment used: None                Ambulation/Gait Ambulation/Gait assistance: Independent Ambulation Distance (Feet): 120 Feet Assistive device: None Gait Pattern/deviations: WFL(Within Functional Limits)   Gait velocity interpretation: at or above normal speed for age/gender General Gait Details: Good gait pattern, balance, and speed.    Stairs            Wheelchair Mobility    Modified Rankin (Stroke Patients Only) Modified Rankin (Stroke Patients Only) Pre-Morbid Rankin Score: No symptoms Modified Rankin: No significant disability     Balance Overall balance assessment: Independent                           High level balance activites: Side stepping;Backward walking;Direction changes;Turns;Sudden stops;Head turns High Level Balance Comments: No loss of balance with high level balance activities             Pertinent Vitals/Pain Pain Assessment: No/denies pain    Home Living Family/patient expects to  be discharged to:: Private residence Living Arrangements: Spouse/significant other;Children Available Help at Discharge: Family;Available PRN/intermittently Type of Home: House (Condo) Home Access: Stairs to enter Entrance Stairs-Rails: Psychiatric nurse of Steps: 5 Home Layout: One level Home Equipment: None      Prior Function Level of Independence: Independent         Comments: Works as Acupuncturist   Dominant Hand: Right    Extremity/Trunk Assessment   Upper Extremity Assessment: LUE deficits/detail       LUE Deficits / Details: Decreased strength and coordination of Lt hand   Lower Extremity Assessment: Overall WFL for tasks assessed      Cervical / Trunk Assessment: Normal  Communication   Communication: No difficulties  Cognition Arousal/Alertness: Awake/alert Behavior During Therapy: WFL for tasks assessed/performed Overall Cognitive Status: Within Functional Limits for tasks assessed                      General Comments      Exercises        Assessment/Plan    PT Assessment Patent does not need any further PT services  PT Diagnosis Hemiplegia non-dominant side   PT Problem List    PT Treatment Interventions     PT Goals (Current goals can be found in the Care Plan section) Acute Rehab PT Goals PT Goal Formulation: All assessment and education complete, DC  therapy    Frequency     Barriers to discharge        Co-evaluation               End of Session   Activity Tolerance: Patient tolerated treatment well Patient left: in bed;with call bell/phone within reach Nurse Communication: Mobility status         Time: [id number] PT Time Calculation (min) (ACUTE ONLY): 13 min   Charges:   PT Evaluation $PT Eval Low Complexity: 1 Procedure     PT G CodesDespina Pole 09-09-2015, 3:14 PM Carita Pian. Sanjuana Kava, [place] Pager [phone]

## 2015-08-15 ENCOUNTER — Inpatient Hospital Stay (HOSPITAL_COMMUNITY): Payer: BLUE CROSS/BLUE SHIELD

## 2015-08-15 DIAGNOSIS — Q2112 Patent foramen ovale: Secondary | ICD-10-CM | POA: Insufficient documentation

## 2015-08-15 DIAGNOSIS — I639 Cerebral infarction, unspecified: Secondary | ICD-10-CM

## 2015-08-15 DIAGNOSIS — I6789 Other cerebrovascular disease: Secondary | ICD-10-CM

## 2015-08-15 DIAGNOSIS — Q211 Atrial septal defect: Secondary | ICD-10-CM | POA: Insufficient documentation

## 2015-08-15 DIAGNOSIS — F1010 Alcohol abuse, uncomplicated: Secondary | ICD-10-CM

## 2015-08-15 DIAGNOSIS — I63411 Cerebral infarction due to embolism of right middle cerebral artery: Principal | ICD-10-CM

## 2015-08-15 LAB — TSH: TSH: 1.194 u[IU]/mL (ref 0.350–4.500)

## 2015-08-15 LAB — VITAMIN B12: Vitamin B-12: 304 pg/mL (ref 180–914)

## 2015-08-15 LAB — RAPID URINE DRUG SCREEN, HOSP PERFORMED
AMPHETAMINES: NOT DETECTED
BENZODIAZEPINES: POSITIVE — AB
Barbiturates: NOT DETECTED
COCAINE: NOT DETECTED
OPIATES: NOT DETECTED
TETRAHYDROCANNABINOL: NOT DETECTED

## 2015-08-15 LAB — HIV ANTIBODY (ROUTINE TESTING W REFLEX): HIV Screen 4th Generation wRfx: NONREACTIVE

## 2015-08-15 LAB — ECHOCARDIOGRAM COMPLETE
Height: 71 in
WEIGHTICAEL: 3232 [oz_av]

## 2015-08-15 LAB — SEDIMENTATION RATE: Sed Rate: 5 mm/hr (ref 0–16)

## 2015-08-15 LAB — RPR: RPR Ser Ql: NONREACTIVE

## 2015-08-15 LAB — HEMOGLOBIN A1C
HEMOGLOBIN A1C: 5.8 % — AB (ref 4.8–5.6)
MEAN PLASMA GLUCOSE: 120 mg/dL

## 2015-08-15 LAB — C-REACTIVE PROTEIN: CRP: 0.8 mg/dL (ref <1.0)

## 2015-08-15 LAB — FOLATE: FOLATE: 14.2 ng/mL (ref >5.9)

## 2015-08-15 MED ORDER — SENNOSIDES-DOCUSATE SODIUM 8.6-50 MG PO TABS: 1.0000 | ORAL_TABLET | Freq: Every evening | ORAL | Status: DC | PRN

## 2015-08-15 MED ORDER — ATORVASTATIN CALCIUM 10 MG PO TABS: 10.0000 mg | ORAL_TABLET | Freq: Every day | ORAL | Status: DC

## 2015-08-15 MED ORDER — ASPIRIN 162 MG PO TBEC: 162.0000 mg | DELAYED_RELEASE_TABLET | Freq: Every day | ORAL | Status: DC

## 2015-08-15 MED ORDER — FOLIC ACID 1 MG PO TABS: 1.0000 mg | ORAL_TABLET | Freq: Every day | ORAL | Status: DC

## 2015-08-15 MED ORDER — THIAMINE HCL 100 MG PO TABS: 100.0000 mg | ORAL_TABLET | Freq: Every day | ORAL | Status: DC

## 2015-08-15 MED ORDER — ADULT MULTIVITAMIN W/MINERALS CH: 1.0000 | ORAL_TABLET | Freq: Every day | ORAL | Status: DC

## 2015-08-15 NOTE — Evaluation (Addendum)
Occupational Therapy Evaluation AND Discharge  Patient Details Name: Aaron [name] MRN: [id number] DOB: 14-Jul-1970 Today's Date: 08/15/2015    History of Present Illness Patient is a 45 yo male admitted 08/13/15 with LUE weakness and numbness.  MRI showed Rt MCA infarct.   PMH:  None relevant.   Clinical Impression   Patient admitted with above. Patient independent PTA. Patient currently functioning at an overall mod I level.  No additional OT needs identified, D/C from acute OT services and no additional follow-up OT needs at this time. All appropriate education provided to patient and family (mother). Please re-order OT if needed.    Educated pt on LUE coordination and strengthening exercises using stress ball and red theraputty. Went over each exercise, wrote out each exercise, and encouraged pt to perform exercises throughout the day.   Patient with some questions regarding driving and working, encouraged him to talk with his doctor regarding this.   Discussed no OT follow-up at this time. Encouraged pt to talk with his PCP or neurologist if LUE did not improve and talk about potentially ordering OPOT.    Follow Up Recommendations  No OT follow up    Equipment Recommendations  None recommended by OT    Recommendations for Other Services  None at this time   Precautions / Restrictions Precautions Precautions: None Restrictions Weight Bearing Restrictions: No      Mobility Bed Mobility Overal bed mobility: Independent  Transfers Overall transfer level: Independent Equipment used: None   Balance Overall balance assessment: Independent    ADL Overall ADL's : Modified independent General ADL Comments: Mod I for increased time    Vision Vision Assessment?: No apparent visual deficits Additional Comments: Pt reports no change from baseline          Pertinent Vitals/Pain Pain Assessment: No/denies pain     Hand Dominance Right   Extremity/Trunk Assessment Upper  Extremity Assessment Upper Extremity Assessment: LUE deficits/detail LUE Deficits / Details: Decreased strength and coordination of Lt hand (minimal as hand is functional) LUE Coordination: decreased fine motor   Lower Extremity Assessment Lower Extremity Assessment: Overall WFL for tasks assessed   Cervical / Trunk Assessment Cervical / Trunk Assessment: Normal   Communication Communication Communication: No difficulties   Cognition Arousal/Alertness: Awake/alert Behavior During Therapy: WFL for tasks assessed/performed Overall Cognitive Status: Within Functional Limits for tasks assessed      Exercises   Other Exercises Other Exercises: Theraputty - making ball in left hand, making log using left hand, pinching down log with thumb and pointer finger, thumb and middle finger, etc... Other Exercises: Theraputty - pulling small beads out of theraputty         Home Living Family/patient expects to be discharged to:: Private residence Living Arrangements: Spouse/significant other;Children Available Help at Discharge: Family;Available PRN/intermittently Type of Home: House Home Access: Stairs to enter [employer] of Steps: 5 Entrance Stairs-Rails: Right;Left Home Layout: One level     Bathroom Shower/Tub: Teacher, early years/pre: Standard     Home Equipment: None    Prior Functioning/Environment Level of Independence: Independent  Comments: Works as Scientist, clinical (histocompatibility and immunogenetics)    OT Diagnosis: Generalized weakness;Hemiplegia non-dominant side   OT Problem List:   N/a, no acute OT needs identified at this time     OT Treatment/Interventions:   N/a, no acute OT needs identified at this time     OT Goals(Current goals can be found in the care plan section) Acute Rehab OT Goals Patient Stated Goal:  increase coordination and strength in left hand  OT Goal Formulation: All assessment and education complete, DC therapy  OT Frequency:   N/a, no acute OT needs  identified at this time     Barriers to D/C:  none known at this time    End of Session Activity Tolerance: Patient tolerated treatment well Patient left: in bed;with call bell/phone within reach;with family/visitor present   Time: [id number] OT Time Calculation (min): 22 min Charges:  OT General Charges $OT Visit: 1 Procedure OT Evaluation $OT Eval Low Complexity: 1 Procedure  Chrys Racer , MS, OTR/L, CLT Pager: [phone]  08/15/2015, 12:31 PM

## 2015-08-15 NOTE — Progress Notes (Signed)
    [facility] has been requested to perform a transesophageal echocardiogram on 3/38/17 for CVA.  After careful review of history and examination, the risks and benefits of transesophageal echocardiogram have been explained including risks of esophageal damage, perforation (1:10,000 risk), bleeding, pharyngeal hematoma as well as other potential complications associated with conscious sedation including aspiration, arrhythmia, respiratory failure and death. Alternatives to treatment were discussed, questions were answered. Patient is willing to proceed. Mother and family in room as well. All agreeable.   Kynzlee Hucker R,  08/15/2015 1:57 PM

## 2015-08-15 NOTE — Progress Notes (Signed)
*  PRELIMINARY RESULTS* Vascular Ultrasound Transcranial Doppler with Bubbles has been completed with Dr. Erlinda Hong. There is evidence of High Intensity Transient Signals (HITS) heard at rest, suggestive of Spencer Degree II. HITS were also heard with Valsalva maneuver, suggestive of Spencer Degree III. These findings are suggestive of a small PFO.  08/15/2015 4:28 PM Maudry Mayhew, RVT, RDCS, RDMS

## 2015-08-15 NOTE — Progress Notes (Signed)
STROKE TEAM PROGRESS NOTE   SUBJECTIVE (INTERVAL HISTORY) His family was at the bedside. He was anxious to go home. Understands need for ongoing testing. Agreeable to stay for TCD bubble study and TEE.   OBJECTIVE Temp:  [98 F (36.7 C)-99 F (37.2 C)] 98.5 F (36.9 C) (03/27 1029) Pulse Rate:  [58-77] 63 (03/27 1029) Cardiac Rhythm:  [-] Sinus bradycardia (03/27 0700) Resp:  [16-20] 16 (03/27 0539) BP: (106-123)/(62-89) 114/67 mmHg (03/27 1029) SpO2:  [96 %-98 %] 98 % (03/27 1029)  CBC:   Recent Labs Lab 08/13/15 1540 08/14/15 0842  WBC 12.0* 6.8  NEUTROABS 10.2* 4.5  HGB 15.3 13.7  HCT 44.3 42.1  MCV 83.4 86.1  PLT 265 [id number]    Basic Metabolic Panel:   Recent Labs Lab 08/13/15 1540 08/14/15 0842  NA 140 140  K 3.9 3.9  CL 105 109  CO2 26 24  GLUCOSE 116* 109*  BUN 19 14  CREATININE 1.02 1.16  CALCIUM 9.9 9.4    Lipid Panel:     Component Value Date/Time   CHOL 145 08/14/2015 0842   TRIG 55 08/14/2015 0842   HDL 51 08/14/2015 0842   CHOLHDL 2.8 08/14/2015 0842   VLDL 11 08/14/2015 0842   LDLCALC 83 08/14/2015 0842   HgbA1c:  Lab Results  Component Value Date   HGBA1C 5.8* 08/14/2015   Urine Drug Screen:     Component Value Date/Time   LABOPIA NONE DETECTED 08/15/2015 0544   COCAINSCRNUR NONE DETECTED 08/15/2015 0544   LABBENZ POSITIVE* 08/15/2015 0544   AMPHETMU NONE DETECTED 08/15/2015 0544   THCU NONE DETECTED 08/15/2015 0544   LABBARB NONE DETECTED 08/15/2015 0544      IMAGING I have personally reviewed the radiological images below and agree with the radiology interpretations.  Dg Chest 2 View 08/14/2015    No active cardiopulmonary disease.   Ct Head Wo Contrast 08/14/2015   No acute intracranial hemorrhage.   Mr Brain Wo Contrast 08/13/2015   1. Small/subtle signal abnormality in the right peri-Rolandic cortex is most compatible with small acute cortical infarcts in the posterior right MCA territory near the left upper extremity  representation area.  2. No associated hemorrhage or mass effect.  3. Elsewhere normal noncontrast MRI appearance of the brain.   Mr Jodene Nam Head/brain Wo Cm 08/14/2015   Negative MRA head.   2D Echocardiogram  - Left ventricle: The cavity size was normal. Wall thickness was increased in a pattern of mild LVH. Systolic function was normal. The estimated ejection fraction was in the range of 55% to 60%. Wall motion was normal; there were no regional wall motion abnormalities. Left ventricular diastolic function parameters were normal. - Atrial septum: No defect or patent foramen ovale was identified. Impressions:   No cardiac source of emboli was indentified.  LE Venous Doppler There is no DVT or SVT noted in the bilateral lower extremities  TCD bubble study - small PFO with spencer degree II at rest and at least III with valsalva   PHYSICAL EXAM Physical Exam General - Well nourished, well developed, in NAD   Cardiovascular - Regular rate and rhythm Pulmonary: CTA Abdomen: NT, ND, normal bowel sounds Extremities: No C/C/E  Neurological Exam Mental Status: Normal Orientation:  Oriented to person, place and time Speech:  Fluent; no dysarthria  Cranial Nerves:  PERRL; EOMI; visual fields full, face grossly symmetric, hearing grossly intact; shrug symmetric and tongue midline  Motor Exam:  Tone:  Within normal limits; Strength: 5/5  throughout except the left grip is weaker  Sensory: Intact to light touch throughout  Coordination:  Intact finger to nose; RAM is impaired in the left hand  Gait: Deferred   ASSESSMENT/PLAN [name] is a 45 y.o. male with no significant past medical history  presenting with acute onset of weakness and numbness involving her left hand and forearm. He did not receive IV t-PA due to minimal deficits.  Stroke:  Non-dominant right MCA small cortical infarcts possibly embolic from an unknown source.  Resultant  Left hand subtle  weakness  MRI  small acute cortical infarcts in the posterior right MCA territory  MRA  negative  Carotid Doppler unremarkable.   2D Echo  EF 55-60, no SOE  Hypercoagulable panel pending  (anti III neg)  Lower extremity venous Dopplers negative  TEE to look for embolic source. Arranged with Valeria for tomorrow.  (I have made patient NPO after midnight tonight).  TCD with bubble study - small PFO with spencer degree II at rest and at least III with valsalva  LDL 83  HgbA1c 5.8  VTE prophylaxis - Lovenox Diet regular Room service appropriate?: Yes; Fluid consistency:: Thin Diet - low sodium heart healthy Diet NPO time specified  No antithrombotic prior to admission, now on aspirin 325 mg daily.   Patient counseled to be compliant with his antithrombotic medications  Ongoing aggressive stroke risk factor management  Therapy recommendations: No follow-up physical therapy recommended.  Disposition:  Anticipate return home  PFO - small in size   TCD bubble study - small PFO with spencer degree II at rest and at least III with valsalva  TEE pending  Treatment options -  ASA vs. PFO closure if no other cause found - pt is considering  Pt active at home - frequent work out with valsalva [place]  BP management  Blood pressure tends to run low  Permissive hypertension (OK if < 220/120) but gradually normalize in 5-7 days  Hyperlipidemia  Home meds: No lipid lowering medications prior to admission  LDL 83, goal < 70  Start Lipitor 10 mg daily  Continue statin at discharge  Other Stroke Risk Factors  ETOH use  Hospital day # 2  Rosalin Hawking, MD PhD Stroke Neurology 08/15/2015 10:14 PM    To contact Stroke Continuity provider, please refer to [email or web address] After hours, contact General Neurology

## 2015-08-15 NOTE — Progress Notes (Signed)
VASCULAR LAB PRELIMINARY  PRELIMINARY  PRELIMINARY  PRELIMINARY  Bilateral lower extremity venous duplex completed.    Preliminary report:  There is no DVT or SVT noted in the bilateral lower extremities.   Roslyn Else, RVT 08/15/2015, 7:39 AM

## 2015-08-15 NOTE — Progress Notes (Signed)
Pt with discharge order but wanting to speak with neurologist first.   Ave Filter, RN.

## 2015-08-15 NOTE — Evaluation (Signed)
Speech Language Pathology Evaluation Patient Details Name: Aaron [name] MRN: [id number] DOB: 1970/10/30 Today's Date: 08/15/2015 Time: 1007-1030 SLP Time Calculation (min) (ACUTE ONLY): 23 min  Problem List:  Patient Active Problem List   Diagnosis Date Noted  . Alcohol abuse 08/14/2015  . Stroke ([place]) 08/13/2015  . Leukocytosis 08/13/2015   Past Medical History: History reviewed. No pertinent past medical history. Past Surgical History: History reviewed. No pertinent past surgical history. HPI:  Aaron [name] is an 45 y.o. male with no known medical disorder and on no medications, presenting with acute onset of weakness and numbness involving her hand and forearm starting at about 8:30 AM on 08/13/2015. He has no previous history of stroke nor TIA. He has not been on antiplatelet therapy. There was no facial weakness no facial droop. There was no left lower extremity weakness no numbness. MRI of his brain showed multiple small areas of ischemic infarction involving right MCA territory.   Assessment / Plan / Recommendation Clinical Impression  Aaron [name] presents with mild to moderate high level cognitive linguistic impairments including selective attention, short term memory, attention to detail. Aaron [name] exhibited poor focus with series 7 subtraction, getting 1st two incorrect, with extended time, then stopping the activitiy due to reduced attention/concentration. He recalled 4/5 words initially, then 3/5 with a delay. Simple time and money problem solving required repetition and min A to ID his errors. Again, I believe simple math to be intact, but attention to details of the instructions I gave to be impaired. Abstract reasoning also impaired. 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     SLP Assessment  Patient needs continued Speech Lanaguage Pathology Services    Follow Up Recommendations  Outpatient SLP    Frequency and  Duration min 1 x/week  1 week      SLP Evaluation Prior Functioning  Cognitive/Linguistic Baseline: Within functional limits Type of Home: House Available Help at Discharge: Family;Available PRN/intermittently Education: Scientist, clinical (histocompatibility and immunogenetics) Vocation: Full time employment   Cognition  Overall Cognitive Status: Impaired/Different from baseline Orientation Level: Oriented X4 Attention: Sustained;Focused Focused Attention: Appears intact Sustained Attention: Impaired Sustained Attention Impairment: Verbal complex Memory: Impaired Memory Impairment: Decreased recall of new information Awareness: Impaired Awareness Impairment: Intellectual impairment Problem Solving: Impaired Executive Function: Organizing;Self Correcting Organizing: Impaired Organizing Impairment: Verbal complex;Verbal basic Self Correcting: Impaired Self Correcting Impairment: Verbal complex;Verbal basic    Comprehension  Auditory Comprehension Overall Auditory Comprehension: Appears within functional limits for tasks assessed    Expression Verbal Expression Overall Verbal Expression: Appears within functional limits for tasks assessed   Oral / Motor  Oral Motor/Sensory Function Overall Oral Motor/Sensory Function: Within functional limits Motor Speech Overall Motor Speech: Appears within functional limits for tasks assessed   GO                    [name], Annye Rusk 08/15/2015, 10:43 AM

## 2015-08-15 NOTE — Procedures (Signed)
[facility]  [address] [place]. [place] [address].  [phone]   TRANSCRANIAL DOPPLER BUBBLE STUDY  Aaron [name]  Date of Birth: Jan 07, 1971 Medical Record Number: [id number] Indications: stroke in young Date of Procedure: 08/15/2015 Clinical History: stroke in young Technical Description: Transcranial Doppler Bubble Study was performed at the bedside after taking written informed consent from the patient and explaining risk/benefits. The right middle cerebral artery was insonated using a hand held probe. And IV line had been previously inserted in the left forearm by the RN using aseptic precautions. Agitated saline injection at rest resulted a few HITS indicating spencer degree II, and after valsalva maneuver it resulted many high intensity transient signals (HITS) reaching at least spencer degree III. However, the first try with valsalva, pt had head movement and we lost signal. The second try, pt was not doing adequate valsalva.  Impression: Positive Transcranial Doppler Bubble Study indicative of a small right to left intracardiac shunt with spencer degree II at rest and at least III with valsalva.   Results were explained to the patient. Questions were answered. I had a long discussion with the patient with regards to the role of patent foramen ovale and risk of stroke. It is unclear at the present time whether PFO closure leads to better secondary stroke prevention or not. Pt is going to consider whether ASA therapy or PFO closure. Will discuss in am again.  Rosalin Hawking, MD PhD Stroke Neurology 08/15/2015 10:07 PM

## 2015-08-15 NOTE — Progress Notes (Signed)
Patient is not being discharge at this time per Neurology team. Dr. Allyson Sabal paged.   Ave Filter, RN

## 2015-08-15 NOTE — Progress Notes (Signed)
  Echocardiogram 2D Echocardiogram has been performed.  Jennette Dubin 08/15/2015, 8:17 AM

## 2015-08-15 NOTE — Patient Instructions (Signed)
  Attention, concentration, high level multi-tasking, short term memory, attention to details   Cognitive Activities you can do at home:   - Solitaire  - [place]  - Chess/Checkers  - Crosswords (easy level)  - [place]  On your computer, tablet or phone: Insurance [email or web address] Merchandiser, retail Hour Chocolate Fix Sort it out Environmental consultant App Photo Quiz App MixTwo App What's the Word?App

## 2015-08-15 NOTE — Care Management Note (Signed)
Case Management Note  Patient Details  Name: Aaron [name] MRN: [id number] Date of Birth: Sep 14, 1970  Subjective/Objective:                    Action/Plan: Patient discharging home with self care. Orders for CM to arrange outpatient speech therapy. CM met with the patient and his mother and they are interested in attending [facility]. Orders place and information on the AVS. CM also inquired about the patient not having a PCP. Pt states he has seen an MD at [facility] and is going to stay with that group. Pt given [facility] number in case he is unable to get a PCP with [facility]. Will update the bedside RN.   Expected Discharge Date:  08/15/15               Expected Discharge Plan:  Home/Self Care  In-House Referral:     Discharge planning Services  CM Consult  Post Acute Care Choice:    Choice offered to:     DME Arranged:    DME Agency:     HH Arranged:    HH Agency:     Status of Service:  Completed, signed off  Medicare Important Message Given:    Date Medicare IM Given:    Medicare IM give by:    Date Additional Medicare IM Given:    Additional Medicare Important Message give by:     If discussed at [place] of Stay Meetings, dates discussed:    Additional Comments:  Pollie Friar, RN 08/15/2015, 11:27 AM

## 2015-08-15 NOTE — Discharge Summary (Addendum)
Physician Discharge Summary  Aaron [name] MRN: [id number] DOB/AGE: [birth year] 45 y.o.  PCP: Pcp Not In System   Admit date: 08/13/2015 Discharge date: 08/15/2015  Discharge Diagnoses:     Principal Problem:   Stroke Aaron [name]) Active Problems:   Leukocytosis   Alcohol abuse  addendum-discharge Canceled for TEE tomorrow  Follow-up recommendations Follow-up with PCP in 3-5 days , including all  additional recommended appointments as below Follow-up CBC, CMP in 3-5 days PCP please follow-up on the results of the hypercoagulable panel     Medication List    TAKE these medications        aspirin 162 MG EC tablet  Take 1 tablet (162 mg total) by mouth daily.     atorvastatin 10 MG tablet  Commonly known as:  LIPITOR  Take 1 tablet (10 mg total) by mouth daily at 6 PM.     folic acid 1 MG tablet  Commonly known as:  FOLVITE  Take 1 tablet (1 mg total) by mouth daily.     multivitamin with minerals Tabs tablet  Take 1 tablet by mouth daily.     senna-docusate 8.6-50 MG tablet  Commonly known as:  Senokot-S  Take 1 tablet by mouth at bedtime as needed for mild constipation.     thiamine 100 MG tablet  Take 1 tablet (100 mg total) by mouth daily.         Discharge Condition: Stable   Discharge Instructions Get Medicines reviewed and adjusted: Please take all your medications with you for your next visit with your Primary MD  Please request your Primary MD to go over all [name] tests and procedure/radiological results at the follow up, please ask your Primary MD to get all [name] records sent to his/her office.  If you experience worsening of your admission symptoms, develop shortness of breath, life threatening emergency, suicidal or homicidal thoughts you must seek medical attention immediately by calling 911 or calling your MD immediately if symptoms less severe.  You must read complete instructions/literature along with all the possible adverse reactions/side  effects for all the Medicines you take and that have been prescribed to you. Take any new Medicines after you have completely understood and accpet all the possible adverse reactions/side effects.   Do not drive when taking Pain medications.   Do not take more than prescribed Pain, Sleep and Anxiety Medications  Special Instructions: If you have smoked or chewed Tobacco in the last 2 yrs please stop smoking, stop any regular Alcohol and or any Recreational drug use.  Wear Seat belts while driving.  Please note  You were cared for by a hospitalist during your [name] stay. Once you are discharged, your primary care physician will handle any further medical issues. Please note that NO REFILLS for any discharge medications will be authorized once you are discharged, as it is imperative that you return to your primary care physician (or establish a relationship with a primary care physician if you do not have one) for your aftercare needs so that they can reassess your need for medications and monitor your lab values.    No Known Allergies    Disposition: Final discharge disposition not confirmed   Consults:  Neurology     Significant Diagnostic Studies:  Dg Chest 2 View  08/14/2015  CLINICAL DATA:  Strokes tonight.  Nonsmoker. EXAM: CHEST  2 VIEW COMPARISON:  None. FINDINGS: The heart size and mediastinal contours are within normal limits. Both lungs are clear. The visualized skeletal  structures are unremarkable. IMPRESSION: No active cardiopulmonary disease. Electronically Signed   By: Lucienne Capers M.D.   On: 08/14/2015 02:11   Ct Head Wo Contrast  08/14/2015  CLINICAL DATA:  45 year old male with stroke EXAM: CT HEAD WITHOUT CONTRAST TECHNIQUE: Contiguous axial images were obtained from the base of the skull through the vertex without intravenous contrast. COMPARISON:  Brain MRI dated 08/13/2015 FINDINGS: The ventricles and the sulci are appropriate in size for the patient's  age. There is no intracranial hemorrhage. No midline shift or mass effect identified. The gray-white matter differentiation is preserved. Small right MCA territory infarcts seen on the prior MRI are not detected on the CT. Right maxillary sinus retention cyst or polyp noted. There is minimal mucoperiosteal thickening of the paranasal sinuses. No air-fluid levels. The mastoid air cells are clear. The calvarium is intact. IMPRESSION: No acute intracranial hemorrhage. Electronically Signed   By: Anner Crete M.D.   On: 08/14/2015 02:17   Mr Brain Wo Contrast  08/13/2015  CLINICAL DATA:  45 year old male with left upper extremity weakness. Tingling in the fingers. Tachypnea. Dizziness. Initial encounter. EXAM: MRI HEAD WITHOUT CONTRAST TECHNIQUE: Multiplanar, multiecho pulse sequences of the brain and surrounding structures were obtained without intravenous contrast. COMPARISON:  None. FINDINGS: Major intracranial vascular flow voids are within normal limits. There is a small focus of cortical restricted diffusion in the right parietal lobe near the lateral peri-Rolandic cortex. There is more subtle cortically based diffusion signal abnormality in the right pre motor area near the left upper extremity motor representation (series 3, image 39). This is associated with subtle cortical FLAIR hyperintensity (series 7, image 20) with no associated hemorrhage or mass effect. No contralateral left hemisphere or posterior fossa restricted diffusion. Elsewhere gray and white matter signal is within normal limits for age throughout the brain. No midline shift, mass effect, evidence of mass lesion, ventriculomegaly, extra-axial collection or acute intracranial hemorrhage. Cervicomedullary junction and pituitary are within normal limits. Negative visualized cervical spine. Normal bone marrow signal. Visible internal auditory structures appear normal. Mastoids are clear. Right maxillary subtotal opacification which appears  related to multiple mucous retention cysts. Small retention cyst in the left maxillary sinus. Mild ethmoid sinus mucosal thickening. Negative orbit and scalp soft tissues. IMPRESSION: 1. 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 2. No associated hemorrhage or mass effect. 3. Elsewhere normal noncontrast MRI appearance of the brain. Electronically Signed   By: Genevie Ann M.D.   On: 08/13/2015 17:32   Mr Jodene Nam Head/brain Wo Cm  08/14/2015  CLINICAL DATA:  Numbness and weakness LEFT upper extremity, follow-up stroke. EXAM: MRA HEAD WITHOUT CONTRAST TECHNIQUE: Angiographic images of the Circle of Willis were obtained using MRA technique without intravenous contrast. COMPARISON:  MRI of the brain August 13, 2015 FINDINGS: Anterior circulation: Normal flow related enhancement of the included cervical, petrous, cavernous and supraclinoid internal carotid arteries. Patent anterior communicating artery. Normal flow related enhancement of the anterior and middle cerebral arteries, including distal segments. Supernumerary anterior cerebral artery arising from RIGHT A1-2 junction. No large vessel occlusion, high-grade stenosis, abnormal luminal irregularity, aneurysm. Posterior circulation: Codominant vertebral artery's. Basilar artery is patent, with normal flow related enhancement of the main branch vessels. Small RIGHT posterior communicating artery present. Normal flow related enhancement of the posterior cerebral arteries. No large vessel occlusion, high-grade stenosis, abnormal luminal irregularity, aneurysm. IMPRESSION: Negative  MRA head. Electronically Signed   By: Elon Alas M.D.   On: 08/14/2015 02:53    2-D echo    Filed Weights   08/13/15 2245  Weight: 91.627 kg (202 lb)     Microbiology: No results found for this or any previous visit  (from the past 240 hour(s)).     Blood Culture No results found for: SDES, Palomas, CULT, REPTSTATUS    Labs: Results for orders placed or performed during the [name] encounter of 08/13/15 (from the past 48 hour(s))  CBG monitoring, ED     Status: None   Collection Time: 08/13/15  2:23 PM  Result Value Ref Range   Glucose-Capillary 95 65 - 99 mg/dL  CBC with Differential     Status: Abnormal   Collection Time: 08/13/15  3:40 PM  Result Value Ref Range   WBC 12.0 (H) 4.0 - 10.5 K/uL   RBC 5.31 4.22 - 5.81 MIL/uL   Hemoglobin 15.3 13.0 - 17.0 g/dL   HCT 44.3 39.0 - 52.0 %   MCV 83.4 78.0 - 100.0 fL   MCH 28.8 26.0 - 34.0 pg   MCHC 34.5 30.0 - 36.0 g/dL   RDW 13.3 11.5 - 15.5 %   Platelets 265 150 - 400 K/uL   Neutrophils Relative % 85 %   Neutro Abs 10.2 (H) 1.7 - 7.7 K/uL   Lymphocytes Relative 8 %   Lymphs Abs 1.0 0.7 - 4.0 K/uL   Monocytes Relative 6 %   Monocytes Absolute 0.8 0.1 - 1.0 K/uL   Eosinophils Relative 1 %   Eosinophils Absolute 0.1 0.0 - 0.7 K/uL   Basophils Relative 0 %   Basophils Absolute 0.0 0.0 - 0.1 K/uL  Comprehensive metabolic panel     Status: Abnormal   Collection Time: 08/13/15  3:40 PM  Result Value Ref Range   Sodium 140 135 - 145 mmol/L   Potassium 3.9 3.5 - 5.1 mmol/L   Chloride 105 101 - 111 mmol/L   CO2 26 22 - 32 mmol/L   Glucose, Bld 116 (H) 65 - 99 mg/dL   BUN 19 6 - 20 mg/dL   Creatinine, Ser 1.02 0.61 - 1.24 mg/dL   Calcium 9.9 8.9 - 10.3 mg/dL   Total Protein 8.3 (H) 6.5 - 8.1 g/dL   Albumin 4.7 3.5 - 5.0 g/dL   AST 24 15 - 41 U/L   ALT 19 17 - 63 U/L   Alkaline Phosphatase 90 38 - 126 U/L   Total Bilirubin 0.9 0.3 - 1.2 mg/dL   GFR calc non Af Amer >60 >60 mL/min   GFR calc Af Amer >60 >60 mL/min    Comment: (NOTE) The eGFR has been calculated using the CKD EPI equation. This calculation has not been validated in all clinical situations. eGFR's persistently <60 mL/min signify possible Chronic Kidney Disease.     Anion gap 9 5 - 15  I-stat troponin, ED     Status: None   Collection Time: 08/13/15  3:44 PM  Result Value Ref Range   Troponin i, poc 0.01 0.00 - 0.08 ng/mL   Comment 3            Comment: Due to the release kinetics of cTnI, a negative result within the first hours of the onset of symptoms does not rule out myocardial infarction with certainty. If myocardial infarction is still suspected, repeat the test at appropriate intervals.   Ethanol     Status: None   Collection  Time: 08/13/15  4:00 PM  Result Value Ref Range   Alcohol, Ethyl (B) <5 <5 mg/dL    Comment:        LOWEST DETECTABLE LIMIT FOR SERUM ALCOHOL IS 5 mg/dL FOR MEDICAL PURPOSES ONLY   CBC WITH DIFFERENTIAL     Status: None   Collection Time: 08/14/15  8:42 AM  Result Value Ref Range   WBC 6.8 4.0 - 10.5 K/uL   RBC 4.89 4.22 - 5.81 MIL/uL   Hemoglobin 13.7 13.0 - 17.0 g/dL   HCT 42.1 39.0 - 52.0 %   MCV 86.1 78.0 - 100.0 fL   MCH 28.0 26.0 - 34.0 pg   MCHC 32.5 30.0 - 36.0 g/dL   RDW 13.6 11.5 - 15.5 %   Platelets 219 150 - 400 K/uL   Neutrophils Relative % 66 %   Neutro Abs 4.5 1.7 - 7.7 K/uL   Lymphocytes Relative 25 %   Lymphs Abs 1.7 0.7 - 4.0 K/uL   Monocytes Relative 6 %   Monocytes Absolute 0.4 0.1 - 1.0 K/uL   Eosinophils Relative 3 %   Eosinophils Absolute 0.2 0.0 - 0.7 K/uL   Basophils Relative 0 %   Basophils Absolute 0.0 0.0 - 0.1 K/uL  Comprehensive metabolic panel     Status: Abnormal   Collection Time: 08/14/15  8:42 AM  Result Value Ref Range   Sodium 140 135 - 145 mmol/L   Potassium 3.9 3.5 - 5.1 mmol/L   Chloride 109 101 - 111 mmol/L   CO2 24 22 - 32 mmol/L   Glucose, Bld 109 (H) 65 - 99 mg/dL   BUN 14 6 - 20 mg/dL   Creatinine, Ser 1.16 0.61 - 1.24 mg/dL   Calcium 9.4 8.9 - 10.3 mg/dL   Total Protein 6.6 6.5 - 8.1 g/dL   Albumin 3.6 3.5 - 5.0 g/dL   AST 20 15 - 41 U/L   ALT 17 17 - 63 U/L   Alkaline Phosphatase 73 38 - 126 U/L   Total Bilirubin 1.0 0.3 - 1.2 mg/dL   GFR  calc non Af Amer >60 >60 mL/min   GFR calc Af Amer >60 >60 mL/min    Comment: (NOTE) The eGFR has been calculated using the CKD EPI equation. This calculation has not been validated in all clinical situations. eGFR's persistently <60 mL/min signify possible Chronic Kidney Disease.    Anion gap 7 5 - 15  Lipid panel     Status: None   Collection Time: 08/14/15  8:42 AM  Result Value Ref Range   Cholesterol 145 0 - 200 mg/dL   Triglycerides 55 <150 mg/dL   HDL 51 >40 mg/dL   Total CHOL/HDL Ratio 2.8 RATIO   VLDL 11 0 - 40 mg/dL   LDL Cholesterol 83 0 - 99 mg/dL    Comment:        Total Cholesterol/HDL:CHD Risk Coronary Heart Disease Risk Table                     Men   Women  1/2 Average Risk   3.4   3.3  Average Risk       5.0   4.4  2 X Average Risk   9.6   7.1  3 X Average Risk  23.4   11.0        Use the calculated Patient Ratio above and the CHD Risk Table to determine the patient's CHD Risk.  ATP III CLASSIFICATION (LDL):  <100     mg/dL   Optimal  100-129  mg/dL   Near or Above                    Optimal  130-159  mg/dL   Borderline  160-189  mg/dL   High  >190     mg/dL   Very High   Antithrombin III     Status: None   Collection Time: 08/14/15  6:57 PM  Result Value Ref Range   AntiThromb III Func 119 75 - 120 %  Urine rapid drug screen (hosp performed)     Status: Abnormal   Collection Time: 08/15/15  5:44 AM  Result Value Ref Range   Opiates NONE DETECTED NONE DETECTED   Cocaine NONE DETECTED NONE DETECTED   Benzodiazepines POSITIVE (A) NONE DETECTED   Amphetamines NONE DETECTED NONE DETECTED   Tetrahydrocannabinol NONE DETECTED NONE DETECTED   Barbiturates NONE DETECTED NONE DETECTED    Comment:        DRUG SCREEN FOR MEDICAL PURPOSES ONLY.  IF CONFIRMATION IS NEEDED FOR ANY PURPOSE, NOTIFY LAB WITHIN 5 DAYS.        LOWEST DETECTABLE LIMITS FOR URINE DRUG SCREEN Drug Class       Cutoff (ng/mL) Amphetamine      1000 Barbiturate       200 Benzodiazepine   638 Tricyclics       466 Opiates          300 Cocaine          300 THC              50   TSH     Status: None   Collection Time: 08/15/15  6:40 AM  Result Value Ref Range   TSH 1.194 0.350 - 4.500 uIU/mL     Lipid Panel     Component Value Date/Time   CHOL 145 08/14/2015 0842   TRIG 55 08/14/2015 0842   HDL 51 08/14/2015 0842   CHOLHDL 2.8 08/14/2015 0842   VLDL 11 08/14/2015 0842   LDLCALC 83 08/14/2015 0842     No results found for: HGBA1C   Lab Results  Component Value Date   LDLCALC 83 08/14/2015   CREATININE 1.16 08/14/2015     HPI   Aaron [name] is an 45 y.o. male with no known medical disorder and on no medications, presenting with acute onset of weakness and numbness involving her hand and forearm starting at about 8:30 AM on 08/13/2015. He has no previous history of stroke nor TIA. He has not been on antiplatelet therapy. There was no facial weakness no facial droop. There was no left lower extremity weakness no numbness. MRI of his brain showed multiple small areas of ischemic infarction involving right MCA territory   [name] COURSE:   Stroke: Non-dominant infarcts possibly embolic from an unknown source.  Resultant Left hand weakness  MRI small acute cortical infarcts in the posterior right MCA territory  MRA negative  Carotid Doppler 1-39% ICA plaquing. Vertebral artery flow is antegrade.  2D Echo pending  LDL 83  HgbA1c pending  VTE prophylaxis - Lovenox  Diet regular Room service appropriate?: Yes; Fluid consistency:: Thin  No antithrombotic prior to admission, now on aspirin 162  mg daily,no DVT or SVT noted in the bilateral lower extremities  Patient counseled to be compliant with his antithrombotic medications  Ongoing aggressive stroke risk factor management  Therapy recommendations: No follow-up physical  therapy recommended.  Disposition:Patient needs to follow-up with PCP  Hypertension  Blood  pressure tends to run low  Permissive hypertension (OK if < 220/120) but gradually normalize in 5-7 days  Hyperlipidemia  Home meds: No lipid lowering medications prior to admission  LDL 83, goal < 70  Continue Lipitor 10 mg daily  Continue statin at discharge  Other Stroke Risk Factors  ETOH use-did not have any withdrawal on CIWA protocol   Other Active Problems  Check hypercoagulable panel per Dr. Sabas Sous     Discharge Exam:    Blood pressure 123/89, pulse 60, temperature 99 F (37.2 C), temperature source Oral, resp. rate 16, height _0  (1.803 m), weight 91.627 kg (202 lb), SpO2 96 %.   HEENT- Normocephalic, no lesions, without obvious abnormality. Normal external eye and conjunctiva. Normal TM's bilaterally. Normal auditory canals and external ears. Normal external nose, mucus membranes and septum. Normal pharynx. Neck supple with no masses, nodes, nodules or enlargement. Cardiovascular - regular rate and rhythm, S1, S2 normal, no murmur, click, rub or gallop Lungs - chest clear, no wheezing, rales, normal symmetric air entry Abdomen - soft, non-tender; bowel sounds normal; no masses, no organomegaly Extremities - no joint deformities, effusion, or inflammation and no edema        Follow-up Information    Follow up with PCP. Schedule an appointment as soon as possible for a visit in 3 days.   Why:  Post [name] follow-up      Signed: Dwight Burdo 08/15/2015, 9:02 AM        Time spent >45 mins

## 2015-08-16 ENCOUNTER — Encounter (HOSPITAL_COMMUNITY): Payer: Self-pay | Admitting: *Deleted

## 2015-08-16 ENCOUNTER — Inpatient Hospital Stay (HOSPITAL_COMMUNITY): Payer: BLUE CROSS/BLUE SHIELD

## 2015-08-16 ENCOUNTER — Encounter (HOSPITAL_COMMUNITY)
Admission: EM | Disposition: A | Discharge status: Home / Self Care | Payer: Self-pay | Source: Home / Self Care | Attending: Internal Medicine

## 2015-08-16 DIAGNOSIS — I639 Cerebral infarction, unspecified: Secondary | ICD-10-CM

## 2015-08-16 HISTORY — PX: TEE WITHOUT CARDIOVERSION: SHX5443

## 2015-08-16 LAB — LUPUS ANTICOAGULANT PANEL
DRVVT: 38.9 s (ref 0.0–44.0)
PTT LA: 36.6 s (ref 0.0–43.6)

## 2015-08-16 LAB — CARDIOLIPIN ANTIBODIES, IGG, IGM, IGA
Anticardiolipin IgA: <9 APL U/mL (ref 0–11)
Anticardiolipin IgG: <9 GPL U/mL (ref 0–14)
Anticardiolipin IgM: <9 MPL U/mL (ref 0–12)

## 2015-08-16 LAB — PROTEIN C ACTIVITY: PROTEIN C ACTIVITY: 139 % (ref 73–180)

## 2015-08-16 LAB — PROTEIN S ACTIVITY: Protein S Activity: 111 % (ref 63–140)

## 2015-08-16 LAB — PROTEIN S, TOTAL: PROTEIN S AG TOTAL: 118 % (ref 60–150)

## 2015-08-16 LAB — HOMOCYSTEINE: HOMOCYSTEINE-NORM: 13.8 umol/L (ref 0.0–15.0)

## 2015-08-16 LAB — ANTINUCLEAR ANTIBODIES, IFA: ANA Ab, IFA: NEGATIVE

## 2015-08-16 LAB — ANTI-DNA ANTIBODY, DOUBLE-STRANDED: ds DNA Ab: <1 IU/mL (ref 0–9)

## 2015-08-16 SURGERY — ECHOCARDIOGRAM, TRANSESOPHAGEAL
Anesthesia: Moderate Sedation

## 2015-08-16 MED ORDER — MIDAZOLAM HCL 5 MG/ML IJ SOLN
INTRAMUSCULAR | Status: AC
  Filled 2015-08-16: qty 2

## 2015-08-16 MED ORDER — FENTANYL CITRATE (PF) 100 MCG/2ML IJ SOLN
INTRAMUSCULAR | Status: DC | PRN
  Administered 2015-08-16 (×2): 25 ug via INTRAVENOUS

## 2015-08-16 MED ORDER — FENTANYL CITRATE (PF) 100 MCG/2ML IJ SOLN
INTRAMUSCULAR | Status: AC
  Filled 2015-08-16: qty 2

## 2015-08-16 MED ORDER — MIDAZOLAM HCL 10 MG/2ML IJ SOLN
INTRAMUSCULAR | Status: DC | PRN
  Administered 2015-08-16 (×2): 2 mg via INTRAVENOUS

## 2015-08-16 MED ORDER — BUTAMBEN-TETRACAINE-BENZOCAINE 2-2-14 % EX AERO
INHALATION_SPRAY | CUTANEOUS | Status: DC | PRN
  Administered 2015-08-16: 2 via TOPICAL

## 2015-08-16 MED ORDER — DIPHENHYDRAMINE HCL 50 MG/ML IJ SOLN
INTRAMUSCULAR | Status: AC
  Filled 2015-08-16: qty 1

## 2015-08-16 MED ORDER — SODIUM CHLORIDE 0.9 % IV SOLN
INTRAVENOUS | Status: DC
  Administered 2015-08-16: 500 mL via INTRAVENOUS

## 2015-08-16 MED ORDER — ASPIRIN EC 325 MG PO TBEC
325.0000 mg | DELAYED_RELEASE_TABLET | Freq: Every day | ORAL | Status: DC
  Filled 2015-08-16: qty 1

## 2015-08-16 MED ORDER — ASPIRIN 325 MG PO TBEC: 325.0000 mg | DELAYED_RELEASE_TABLET | Freq: Every day | ORAL | Status: DC

## 2015-08-16 NOTE — Discharge Summary (Addendum)
Physician Discharge Summary  Aaron [name] MRN: [id number] DOB/AGE: Dec 29, 1970 45 y.o.  PCP: Pcp Not In System   Admit date: 08/13/2015 Discharge date: 08/16/2015  Discharge Diagnoses:     Principal Problem:   Stroke [facility]) Active Problems:   Leukocytosis   PFO (patent foramen ovale)     Follow-up recommendations Follow-up with PCP in 3-5 days , including all  additional recommended appointments as below Follow-up CBC, CMP in 3-5 days PCP please follow-up on the results of the hypercoagulable panel     Medication List    TAKE these medications        aspirin 325 MG EC tablet  Take 1 tablet (162 mg total) by mouth daily.     atorvastatin 10 MG tablet  Commonly known as:  LIPITOR  Take 1 tablet (10 mg total) by mouth daily at 6 PM.     folic acid 1 MG tablet  Commonly known as:  FOLVITE  Take 1 tablet (1 mg total) by mouth daily.     multivitamin with minerals Tabs tablet  Take 1 tablet by mouth daily.     senna-docusate 8.6-50 MG tablet  Commonly known as:  Senokot-S  Take 1 tablet by mouth at bedtime as needed for mild constipation.     thiamine 100 MG tablet  Take 1 tablet (100 mg total) by mouth daily.         Discharge Condition: Stable   Discharge Instructions Get Medicines reviewed and adjusted: Please take all your medications with you for your next visit with your Primary MD  Please request your Primary MD to go over all hospital tests and procedure/radiological results at the follow up, please ask your Primary MD to get all Hospital records sent to his/her office.  If you experience worsening of your admission symptoms, develop shortness of breath, life threatening emergency, suicidal or homicidal thoughts you must seek medical attention immediately by calling 911 or calling your MD immediately if symptoms less severe.  You must read complete instructions/literature along with all the possible adverse reactions/side effects for all the Medicines  you take and that have been prescribed to you. Take any new Medicines after you have completely understood and accpet all the possible adverse reactions/side effects.   Do not drive when taking Pain medications.   Do not take more than prescribed Pain, Sleep and Anxiety Medications  Special Instructions: If you have smoked or chewed Tobacco in the last 2 yrs please stop smoking, stop any regular Alcohol and or any Recreational drug use.  Wear Seat belts while driving.  Please note  You were cared for by a hospitalist during your hospital stay. Once you are discharged, your primary care physician will handle any further medical issues. Please note that NO REFILLS for any discharge medications will be authorized once you are discharged, as it is imperative that you return to your primary care physician (or establish a relationship with a primary care physician if you do not have one) for your aftercare needs so that they can reassess your need for medications and monitor your lab values.  Discharge Instructions    Ambulatory referral to Speech Therapy    Complete by:  As directed      Diet - low sodium heart healthy    Complete by:  As directed      Increase activity slowly    Complete by:  As directed            No Known Allergies  Disposition: Final discharge disposition not confirmed   Consults:  Neurology     Significant Diagnostic Studies:  Dg Chest 2 View  08/14/2015  CLINICAL DATA:  Strokes tonight.  Nonsmoker. EXAM: CHEST  2 VIEW COMPARISON:  None. FINDINGS: The heart size and mediastinal contours are within normal limits. Both lungs are clear. The visualized skeletal structures are unremarkable. IMPRESSION: No active cardiopulmonary disease. Electronically Signed   By: Lucienne Capers M.D.   On: 08/14/2015 02:11   Ct Head Wo Contrast  08/14/2015  CLINICAL DATA:  45 year old male with stroke EXAM: CT HEAD WITHOUT CONTRAST TECHNIQUE: Contiguous axial images were  obtained from the base of the skull through the vertex without intravenous contrast. COMPARISON:  Brain MRI dated 08/13/2015 FINDINGS: The ventricles and the sulci are appropriate in size for the patient's age. There is no intracranial hemorrhage. No midline shift or mass effect identified. The gray-white matter differentiation is preserved. Small right MCA territory infarcts seen on the prior MRI are not detected on the CT. Right maxillary sinus retention cyst or polyp noted. There is minimal mucoperiosteal thickening of the paranasal sinuses. No air-fluid levels. The mastoid air cells are clear. The calvarium is intact. IMPRESSION: No acute intracranial hemorrhage. Electronically Signed   By: Anner Crete M.D.   On: 08/14/2015 02:17   Mr Brain Wo Contrast  08/13/2015  CLINICAL DATA:  45 year old male with left upper extremity weakness. Tingling in the fingers. Tachypnea. Dizziness. Initial encounter. EXAM: MRI HEAD WITHOUT CONTRAST TECHNIQUE: Multiplanar, multiecho pulse sequences of the brain and surrounding structures were obtained without intravenous contrast. COMPARISON:  None. FINDINGS: Major intracranial vascular flow voids are within normal limits. There is a small focus of cortical restricted diffusion in the right parietal lobe near the lateral peri-Rolandic cortex. There is more subtle cortically based diffusion signal abnormality in the right pre motor area near the left upper extremity motor representation (series 3, image 39). This is associated with subtle cortical FLAIR hyperintensity (series 7, image 20) with no associated hemorrhage or mass effect. No contralateral left hemisphere or posterior fossa restricted diffusion. Elsewhere gray and white matter signal is within normal limits for age throughout the brain. No midline shift, mass effect, evidence of mass lesion, ventriculomegaly, extra-axial collection or acute intracranial hemorrhage. Cervicomedullary junction and pituitary are within  normal limits. Negative visualized cervical spine. Normal bone marrow signal. Visible internal auditory structures appear normal. Mastoids are clear. Right maxillary subtotal opacification which appears related to multiple mucous retention cysts. Small retention cyst in the left maxillary sinus. Mild ethmoid sinus mucosal thickening. Negative orbit and scalp soft tissues. IMPRESSION: 1. 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 2. No associated hemorrhage or mass effect. 3. Elsewhere normal noncontrast MRI appearance of the brain. Electronically Signed   By: Genevie Ann M.D.   On: 08/13/2015 17:32   Mr Jodene Nam Head/brain Wo Cm  08/14/2015  CLINICAL DATA:  Numbness and weakness LEFT upper extremity, follow-up stroke. EXAM: MRA HEAD WITHOUT CONTRAST TECHNIQUE: Angiographic images of the Circle of Willis were obtained using MRA technique without intravenous contrast. COMPARISON:  MRI of the brain August 13, 2015 FINDINGS: Anterior circulation: Normal flow related enhancement of the included cervical, petrous, cavernous and supraclinoid internal carotid arteries. Patent anterior communicating artery. Normal flow related enhancement of the anterior and middle cerebral arteries, including distal segments. Supernumerary anterior cerebral artery arising from  RIGHT A1-2 junction. No large vessel occlusion, high-grade stenosis, abnormal luminal irregularity, aneurysm. Posterior circulation: Codominant vertebral artery's. Basilar artery is patent, with normal flow related enhancement of the main branch vessels. Small RIGHT posterior communicating artery present. Normal flow related enhancement of the posterior cerebral arteries. No large vessel occlusion, high-grade stenosis, abnormal luminal irregularity, aneurysm. IMPRESSION: Negative MRA head. Electronically Signed    By: Elon Alas M.D.   On: 08/14/2015 02:53    2-D echo    Filed Weights   08/13/15 2245  Weight: 91.627 kg (202 lb)     Microbiology: No results found for this or any previous visit (from the past 240 hour(s)).     Blood Culture No results found for: SDES, [place], CULT, REPTSTATUS    Labs: Results for orders placed or performed during the hospital encounter of 08/13/15 (from the past 48 hour(s))  Antithrombin III     Status: None   Collection Time: 08/14/15  6:57 PM  Result Value Ref Range   AntiThromb III Func 119 75 - 120 %  Urine rapid drug screen (hosp performed)     Status: Abnormal   Collection Time: 08/15/15  5:44 AM  Result Value Ref Range   Opiates NONE DETECTED NONE DETECTED   Cocaine NONE DETECTED NONE DETECTED   Benzodiazepines POSITIVE (A) NONE DETECTED   Amphetamines NONE DETECTED NONE DETECTED   Tetrahydrocannabinol NONE DETECTED NONE DETECTED   Barbiturates NONE DETECTED NONE DETECTED    Comment:        DRUG SCREEN FOR MEDICAL PURPOSES ONLY.  IF CONFIRMATION IS NEEDED FOR ANY PURPOSE, NOTIFY LAB WITHIN 5 DAYS.        LOWEST DETECTABLE LIMITS FOR URINE DRUG SCREEN Drug Class       Cutoff (ng/mL) Amphetamine      1000 Barbiturate      200 Benzodiazepine   [id number] Tricyclics       [id number] Opiates          300 Cocaine          300 THC              50   TSH     Status: None   Collection Time: 08/15/15  6:40 AM  Result Value Ref Range   TSH 1.194 0.350 - 4.500 uIU/mL  Vitamin B12     Status: None   Collection Time: 08/15/15  6:40 AM  Result Value Ref Range   Vitamin B-12 304 180 - 914 pg/mL    Comment: (NOTE) This assay is not validated for testing neonatal or myeloproliferative syndrome specimens for Vitamin B12 levels.   RPR     Status: None   Collection Time: 08/15/15  6:40 AM  Result Value Ref Range   RPR Ser Ql Non Reactive Non Reactive    Comment: (NOTE) Performed At: [facility] [address] [place], [place]  [id number] Lindon Romp MD [id number]   HIV antibody     Status: None   Collection Time: 08/15/15  6:40 AM  Result Value Ref Range   HIV Screen 4th Generation wRfx Non Reactive Non Reactive    Comment: (NOTE) Performed At: [facility] [place], [place] [id number] Lindon Romp MD [id number]   Folate     Status: None   Collection Time: 08/15/15  6:40 AM  Result Value Ref Range   Folate 14.2 >5.9 ng/mL  Sedimentation rate     Status: None   Collection Time: 08/15/15  6:40 AM  Result Value Ref Range   Sed Rate 5 0 - 16 mm/hr  C-reactive protein     Status: None   Collection Time: 08/15/15  6:40 AM  Result Value Ref Range   CRP 0.8 <1.0 mg/dL     Lipid Panel     Component Value Date/Time   CHOL 145 08/14/2015 0842   TRIG 55 08/14/2015 0842   HDL 51 08/14/2015 0842   CHOLHDL 2.8 08/14/2015 0842   VLDL 11 08/14/2015 0842   LDLCALC 83 08/14/2015 0842     Lab Results  Component Value Date   HGBA1C 5.8* 08/14/2015     Lab Results  Component Value Date   LDLCALC 83 08/14/2015   CREATININE 1.16 08/14/2015     HPI   Aaron [name] is an 45 y.o. male with no known medical disorder and on no medications, presenting with acute onset of weakness and numbness involving her hand and forearm starting at about 8:30 AM on 08/13/2015. He has no previous history of stroke nor TIA. He has not been on antiplatelet therapy. There was no facial weakness no facial droop. There was no left lower extremity weakness no numbness. MRI of his brain showed multiple small areas of ischemic infarction involving right MCA territory   HOSPITAL COURSE:   Stroke: Non-dominant infarcts possibly embolic from an unknown source.  Resultant Left hand weakness  MRI small acute cortical infarcts in the posterior right MCA territory  MRA negative  Carotid Doppler 1-39% ICA plaquing. Vertebral artery flow is antegrade.  TCD with bubble study - small PFO with  spencer degree II at rest and at least III with valsalva  2D Echo ejection fraction 55-60%, no cardiac source of emboli  LDL 83  HgbA1c 5.8  VTE prophylaxis - Lovenox  Diet regular Room service appropriate?: Yes; Fluid consistency:: Thin  No antithrombotic prior to admission, now on aspirin 162  mg daily,no DVT or SVT noted in the bilateral lower extremities  Patient counseled to be compliant with his antithrombotic medications  Ongoing aggressive stroke risk factor management  Therapy recommendations: No follow-up physical therapy recommended.  Disposition:Patient needs to follow-up with PCP   PFO - small in size   TCD bubble study - small PFO with spencer degree II at rest and at least III with valsalva  TEE shows  PFO = possible source of embolism.   Treatment options - ASA vs. PFO closure if no other cause found - pt is considering  Patient is interested in RESPECT-ESUS now. Will have research group follow up with him   Hypertension  Blood pressure tends to run low  Permissive hypertension (OK if < 220/120) but gradually normalize in 5-7 days  Hyperlipidemia  Home meds: No lipid lowering medications prior to admission  LDL 83, goal < 70  Continue Lipitor 10 mg daily  Continue statin at discharge  Other Stroke Risk Factors  ETOH use-no hx of abuse ,did not have any withdrawal on CIWA protocol   Other Active Problems  Check hypercoagulable pending     Discharge Exam:    Blood pressure 159/101, pulse 64, temperature 97.9 F (36.6 C), temperature source Oral, resp. rate 14, height 5\' 11"  (1.803 m), weight 91.627 kg (202 lb), SpO2 97 %.   HEENT- Normocephalic, no lesions, without obvious abnormality. Normal external eye and conjunctiva. Normal TM's bilaterally. Normal auditory canals and external ears. Normal external nose, mucus membranes and septum. Normal pharynx. Neck supple with no masses, nodes, nodules or  enlargement. Cardiovascular - regular rate and rhythm, S1, S2 normal, no murmur, click, rub or gallop Lungs - chest clear, no wheezing, rales, normal symmetric air entry Abdomen - soft, non-tender; bowel sounds normal; no masses, no organomegaly Extremities - no joint deformities, effusion, or inflammation and no edema    Follow-up Information    Follow up with PCP. Schedule an appointment as soon as possible for a visit in 3 days.   Why:  Post hospital follow-up      Follow up with SETHI,PRAMOD, MD. Schedule an appointment as soon as possible for a visit in 2 months.   Specialties:  Neurology, Radiology   Why:  Stroke follow-up   Contact information:   [address] [place] [address] [phone]       Follow up with [facility].   Specialty:  Rehabilitation   Why:  They will contact you to set up the appointment.    Contact information:   [address] [place] [id number] [place] [id number] [phone]      Signed: Reyne Dumas 08/16/2015, 1:17 PM        Time spent >45 mins

## 2015-08-16 NOTE — CV Procedure (Signed)
Procedure: TEE  Sedation: Versed 4 mg IV, Fentanyl 50 mcg IV  Indication: CVA  Findings:  Please see echo section for full report.  Normal LV size and systolic function, EF 0000000.  Normal RV size and systolic function.  Trivial TR, trivial MR.  Trileaflet aortic valve with no stenosis or regurgitation.  Normal left atrial size, no LA appendage thrombus.  Normal right atrial size.  There was a small PFO visible by color doppler and bubble study.  Mildly dilated aortic root at 4.1 cm.  No significant thoracic aorta plaque.   PFO = possible source of embolism.   Loralie Champagne 08/16/2015 2:35 PM

## 2015-08-16 NOTE — Progress Notes (Signed)
  Echocardiogram Echocardiogram Transesophageal has been performed.  Darlina Sicilian M 08/16/2015, 2:50 PM

## 2015-08-16 NOTE — H&P (View-Only) (Signed)
STROKE TEAM PROGRESS NOTE   SUBJECTIVE (INTERVAL HISTORY) His family was at the bedside. He was anxious to go home. Understands need for ongoing testing. Agreeable to stay for TCD bubble study and TEE.   OBJECTIVE Temp:  [98 F (36.7 C)-99 F (37.2 C)] 98.5 F (36.9 C) (03/27 1029) Pulse Rate:  [58-77] 63 (03/27 1029) Cardiac Rhythm:  [-] Sinus bradycardia (03/27 0700) Resp:  [16-20] 16 (03/27 0539) BP: (106-123)/(62-89) 114/67 mmHg (03/27 1029) SpO2:  [96 %-98 %] 98 % (03/27 1029)  CBC:   Recent Labs Lab 08/13/15 1540 08/14/15 0842  WBC 12.0* 6.8  NEUTROABS 10.2* 4.5  HGB 15.3 13.7  HCT 44.3 42.1  MCV 83.4 86.1  PLT 265 [id number]    Basic Metabolic Panel:   Recent Labs Lab 08/13/15 1540 08/14/15 0842  NA 140 140  K 3.9 3.9  CL 105 109  CO2 26 24  GLUCOSE 116* 109*  BUN 19 14  CREATININE 1.02 1.16  CALCIUM 9.9 9.4    Lipid Panel:     Component Value Date/Time   CHOL 145 08/14/2015 0842   TRIG 55 08/14/2015 0842   HDL 51 08/14/2015 0842   CHOLHDL 2.8 08/14/2015 0842   VLDL 11 08/14/2015 0842   LDLCALC 83 08/14/2015 0842   HgbA1c:  Lab Results  Component Value Date   HGBA1C 5.8* 08/14/2015   Urine Drug Screen:     Component Value Date/Time   LABOPIA NONE DETECTED 08/15/2015 0544   COCAINSCRNUR NONE DETECTED 08/15/2015 0544   LABBENZ POSITIVE* 08/15/2015 0544   AMPHETMU NONE DETECTED 08/15/2015 0544   THCU NONE DETECTED 08/15/2015 0544   LABBARB NONE DETECTED 08/15/2015 0544      IMAGING I have personally reviewed the radiological images below and agree with the radiology interpretations.  Dg Chest 2 View 08/14/2015    No active cardiopulmonary disease.   Ct Head Wo Contrast 08/14/2015   No acute intracranial hemorrhage.   Mr Brain Wo Contrast 08/13/2015   1. Small/subtle signal abnormality in the right peri-Rolandic cortex is most compatible with small acute cortical infarcts in the posterior right MCA territory near the left upper extremity  representation area.  2. No associated hemorrhage or mass effect.  3. Elsewhere normal noncontrast MRI appearance of the brain.   Mr Jodene Nam Head/brain Wo Cm 08/14/2015   Negative MRA head.   2D Echocardiogram  - Left ventricle: The cavity size was normal. Wall thickness was increased in a pattern of mild LVH. Systolic function was normal. The estimated ejection fraction was in the range of 55% to 60%. Wall motion was normal; there were no regional wall motion abnormalities. Left ventricular diastolic function parameters were normal. - Atrial septum: No defect or patent foramen ovale was identified. Impressions:   No cardiac source of emboli was indentified.  LE Venous Doppler There is no DVT or SVT noted in the bilateral lower extremities  TCD bubble study - small PFO with spencer degree II at rest and at least III with valsalva   PHYSICAL EXAM Physical Exam General - Well nourished, well developed, in NAD   Cardiovascular - Regular rate and rhythm Pulmonary: CTA Abdomen: NT, ND, normal bowel sounds Extremities: No C/C/E  Neurological Exam Mental Status: Normal Orientation:  Oriented to person, place and time Speech:  Fluent; no dysarthria  Cranial Nerves:  PERRL; EOMI; visual fields full, face grossly symmetric, hearing grossly intact; shrug symmetric and tongue midline  Motor Exam:  Tone:  Within normal limits; Strength: 5/5  throughout except the left grip is weaker  Sensory: Intact to light touch throughout  Coordination:  Intact finger to nose; RAM is impaired in the left hand  Gait: Deferred   ASSESSMENT/PLAN Mr. Lealon Skillings is a 45 y.o. male with no significant past medical history  presenting with acute onset of weakness and numbness involving her left hand and forearm. He did not receive IV t-PA due to minimal deficits.  Stroke:  Non-dominant right MCA small cortical infarcts possibly embolic from an unknown source.  Resultant  Left hand subtle  weakness  MRI  small acute cortical infarcts in the posterior right MCA territory  MRA  negative  Carotid Doppler unremarkable.   2D Echo  EF 55-60, no SOE  Hypercoagulable panel pending  (anti III neg)  Lower extremity venous Dopplers negative  TEE to look for embolic source. Arranged with [place] for tomorrow.  (I have made patient NPO after midnight tonight).  TCD with bubble study - small PFO with spencer degree II at rest and at least III with valsalva  LDL 83  HgbA1c 5.8  VTE prophylaxis - Lovenox Diet regular Room service appropriate?: Yes; Fluid consistency:: Thin Diet - low sodium heart healthy Diet NPO time specified  No antithrombotic prior to admission, now on aspirin 325 mg daily.   Patient counseled to be compliant with his antithrombotic medications  Ongoing aggressive stroke risk factor management  Therapy recommendations: No follow-up physical therapy recommended.  Disposition:  Anticipate return home  PFO - small in size   TCD bubble study - small PFO with spencer degree II at rest and at least III with valsalva  TEE pending  Treatment options -  ASA vs. PFO closure if no other cause found - pt is considering  Pt active at home - frequent work out with valsalva [place]  BP management  Blood pressure tends to run low  Permissive hypertension (OK if < 220/120) but gradually normalize in 5-7 days  Hyperlipidemia  Home meds: No lipid lowering medications prior to admission  LDL 83, goal < 70  Start Lipitor 10 mg daily  Continue statin at discharge  Other Stroke Risk Factors  ETOH use  Hospital day # 2  Rosalin Hawking, MD PhD Stroke Neurology 08/15/2015 10:14 PM    To contact Stroke Continuity provider, please refer to [email or web address] After hours, contact General Neurology

## 2015-08-16 NOTE — Progress Notes (Signed)
D/C orders received, pt for D/C home today.  IV and telemetry D/C.  Rx and D/C instructions given with verbalized understanding.  Family at bedside to assist with D/C.  Staff brought pt downstairs via wheelchair.  

## 2015-08-16 NOTE — Interval H&P Note (Signed)
History and Physical Interval Note:  08/16/2015 2:19 PM  Aaron [name]  has presented today for surgery, with the diagnosis of STROKE  The various methods of treatment have been discussed with the patient and family. After consideration of risks, benefits and other options for treatment, the patient has consented to  Procedure(s): TRANSESOPHAGEAL ECHOCARDIOGRAM (TEE) (N/A) as a surgical intervention .  The patient's history has been reviewed, patient examined, no change in status, stable for surgery.  I have reviewed the patient's chart and labs.  Questions were answered to the patient's satisfaction.     Pihu Basil [employer]

## 2015-08-16 NOTE — Progress Notes (Signed)
STROKE TEAM PROGRESS NOTE   SUBJECTIVE (INTERVAL HISTORY) His family is at the bedside. He complains that his mouth is dry. He is awaiting TEE. Hopes for discharge following. He expressed interest for RESPECT ESUS instead of PFO closure.   OBJECTIVE Temp:  [97.9 F (36.6 C)-98.6 F (37 C)] 98.2 F (36.8 C) (03/28 0952) Pulse Rate:  [57-66] 66 (03/28 0952) Cardiac Rhythm:  [-] Normal sinus rhythm;Bundle branch block (03/28 0700) Resp:  [16-20] 18 (03/28 0952) BP: (103-118)/(65-87) 112/68 mmHg (03/28 0952) SpO2:  [96 %-100 %] 100 % (03/28 0952)  CBC:   Recent Labs Lab 08/13/15 1540 08/14/15 0842  WBC 12.0* 6.8  NEUTROABS 10.2* 4.5  HGB 15.3 13.7  HCT 44.3 42.1  MCV 83.4 86.1  PLT 265 [id number]    Basic Metabolic Panel:   Recent Labs Lab 08/13/15 1540 08/14/15 0842  NA 140 140  K 3.9 3.9  CL 105 109  CO2 26 24  GLUCOSE 116* 109*  BUN 19 14  CREATININE 1.02 1.16  CALCIUM 9.9 9.4    Lipid Panel:     Component Value Date/Time   CHOL 145 08/14/2015 0842   TRIG 55 08/14/2015 0842   HDL 51 08/14/2015 0842   CHOLHDL 2.8 08/14/2015 0842   VLDL 11 08/14/2015 0842   LDLCALC 83 08/14/2015 0842   HgbA1c:  Lab Results  Component Value Date   HGBA1C 5.8* 08/14/2015   Urine Drug Screen:     Component Value Date/Time   LABOPIA NONE DETECTED 08/15/2015 0544   COCAINSCRNUR NONE DETECTED 08/15/2015 0544   LABBENZ POSITIVE* 08/15/2015 0544   AMPHETMU NONE DETECTED 08/15/2015 0544   THCU NONE DETECTED 08/15/2015 0544   LABBARB NONE DETECTED 08/15/2015 0544      IMAGING I have personally reviewed the radiological images below and agree with the radiology interpretations.  Dg Chest 2 View 08/14/2015    No active cardiopulmonary disease.   Ct Head Wo Contrast 08/14/2015   No acute intracranial hemorrhage.   Mr Brain Wo Contrast 08/13/2015   1. Small/subtle signal abnormality in the right peri-Rolandic cortex is most compatible with small acute cortical infarcts in  the posterior right MCA territory near the left upper extremity representation area.  2. No associated hemorrhage or mass effect.  3. Elsewhere normal noncontrast MRI appearance of the brain.   Mr Jodene Nam Head/brain Wo Cm 08/14/2015   Negative MRA head.   2D Echocardiogram  - Left ventricle: The cavity size was normal. Wall thickness was increased in a pattern of mild LVH. Systolic function was normal. The estimated ejection fraction was in the range of 55% to 60%. Wall motion was normal; there were no regional wall motion abnormalities. Left ventricular diastolic function parameters were normal. - Atrial septum: No defect or patent foramen ovale was identified. Impressions:   No cardiac source of emboli was indentified.  LE Venous Doppler There is no DVT or SVT noted in the bilateral lower extremities  TCD bubble study - small PFO with spencer degree II at rest and at least III with valsalva  TEE - Normal LV size and systolic function, EF 0000000. Normal RV size and systolic function. Trivial TR, trivial MR. Trileaflet aortic valve with no stenosis or regurgitation. Normal left atrial size, no LA appendage thrombus. Normal right atrial size. There was a small PFO visible by color doppler and bubble study. Mildly dilated aortic root at 4.1 cm. No significant thoracic aorta plaque.    PHYSICAL EXAM Physical Exam General - Well  nourished, well developed, in NAD   Cardiovascular - Regular rate and rhythm Pulmonary: CTA Abdomen: NT, ND, normal bowel sounds Extremities: No C/C/E  Neurological Exam Mental Status: Normal Orientation:  Oriented to person, place and time Speech:  Fluent; no dysarthria  Cranial Nerves:  PERRL; EOMI; visual fields full, face grossly symmetric, hearing grossly intact; shrug symmetric and tongue midline  Motor Exam:  Tone:  Within normal limits; Strength: 5/5 throughout except the left grip is weaker  Sensory: Intact to light touch  throughout  Coordination:  Intact finger to nose; RAM is impaired in the left hand  Gait: Deferred   ASSESSMENT/PLAN [name] is a 45 y.o. male with no significant past medical history  presenting with acute onset of weakness and numbness involving her left hand and forearm. He did not receive IV t-PA due to minimal deficits.  Stroke:  Non-dominant right MCA small cortical infarcts possibly embolic from an unknown source.  Resultant  Left hand subtle weakness  MRI  small acute cortical infarcts in the posterior right MCA territory  MRA  negative  Carotid Doppler unremarkable.   2D Echo  EF 55-60, no SOE  Hypercoagulable panel pending  (anti III neg)  Lower extremity venous Dopplers negative  TEE showed small PFO. No other source of emboli  TCD with bubble study - small PFO with spencer degree II at rest and at least III with valsalva  LDL 83  HgbA1c 5.8  VTE prophylaxis - Lovenox Diet - low sodium heart healthy Diet NPO time specified  No antithrombotic prior to admission, now on aspirin 162 mg daily. Increase aspirin to 325 mg daily. Order adjusted  Patient counseled to be compliant with his antithrombotic medications  Ongoing aggressive stroke risk factor management  Therapy recommendations: No follow-up physical therapy recommended.  Disposition:  Anticipate return home  PFO - small in size   TCD bubble study - small PFO with spencer degree II at rest and at least III with valsalva  TEE confirmed small PFO  Treatment options -  ASA vs. PFO closure if no other cause found - pt is considering  Pt active at home - frequent work out with valsalva [place]  Patient is interested in RESPECT-ESUS now. Will have research group follow up with him.  BP management  Blood pressure tends to run low  BP goal normotensive  Hyperlipidemia  Home meds: No lipid lowering medications prior to admission  LDL 83, goal < 70  Start Lipitor 10 mg  daily  Continue statin at discharge  Other Stroke Risk Factors  ETOH use  Hospital day # 3  Neurology will sign off. Please call with questions. Pt will follow up with Dr. Erlinda Hong at [facility] in about 1 month. Thanks for the consult.  Rosalin Hawking, MD PhD Stroke Neurology 08/16/2015 3:36 PM   To contact Stroke Continuity provider, please refer to [email or web address] After hours, contact General Neurology

## 2015-08-17 LAB — BETA-2-GLYCOPROTEIN I ABS, IGG/M/A
Beta-2 Glyco I IgG: <9 GPI IgG units (ref 0–20)
Beta-2-Glycoprotein I IgA: <9 GPI IgA units (ref 0–25)
Beta-2-Glycoprotein I IgM: <9 GPI IgM units (ref 0–32)

## 2015-08-17 LAB — PROTEIN C, TOTAL: Protein C, Total: 112 % (ref 60–150)

## 2015-08-18 LAB — FACTOR 5 LEIDEN

## 2015-08-19 LAB — PROTHROMBIN GENE MUTATION

## 2015-08-22 DIAGNOSIS — Z6827 Body mass index (BMI) 27.0-27.9, adult: Secondary | ICD-10-CM | POA: Diagnosis not present

## 2015-08-22 DIAGNOSIS — I639 Cerebral infarction, unspecified: Secondary | ICD-10-CM | POA: Diagnosis not present

## 2015-08-22 DIAGNOSIS — E784 Other hyperlipidemia: Secondary | ICD-10-CM | POA: Diagnosis not present

## 2015-08-22 DIAGNOSIS — Q211 Atrial septal defect: Secondary | ICD-10-CM | POA: Diagnosis not present

## 2015-09-21 ENCOUNTER — Ambulatory Visit (INDEPENDENT_AMBULATORY_CARE_PROVIDER_SITE_OTHER): Payer: BLUE CROSS/BLUE SHIELD | Admitting: Neurology

## 2015-09-21 ENCOUNTER — Encounter: Payer: Self-pay | Admitting: Neurology

## 2015-09-21 VITALS — BP 122/88 | HR 73 | Ht 71.0 in | Wt 206.4 lb

## 2015-09-21 DIAGNOSIS — I63411 Cerebral infarction due to embolism of right middle cerebral artery: Secondary | ICD-10-CM | POA: Diagnosis not present

## 2015-09-21 DIAGNOSIS — Q211 Atrial septal defect: Secondary | ICD-10-CM

## 2015-09-21 DIAGNOSIS — Q2112 Patent foramen ovale: Secondary | ICD-10-CM

## 2015-09-21 MED ORDER — ATORVASTATIN CALCIUM 10 MG PO TABS: 10.0000 mg | ORAL_TABLET | Freq: Every day | ORAL | Status: DC

## 2015-09-21 MED ORDER — ASPIRIN 325 MG PO TBEC: 325.0000 mg | DELAYED_RELEASE_TABLET | Freq: Every day | ORAL | Status: DC

## 2015-09-21 NOTE — Patient Instructions (Addendum)
-   continue ASA and lipitor for stroke prevention - may consider to take off lipitor later - please consider research study for stroke prevention. Will give you more information about the study - Follow up with your primary care physician for stroke risk factor modification. Recommend maintain blood pressure goal <130/80, diabetes with hemoglobin A1c goal below 6.5% and lipids with LDL cholesterol goal below 70 mg/dL.  - check BP periodically - healthy diet and regular exercise. - follow up in 3 months.

## 2015-09-21 NOTE — Progress Notes (Signed)
STROKE NEUROLOGY FOLLOW UP NOTE  NAME: Aaron [name] DOB: December 06, 1970  REASON FOR VISIT: stroke follow up HISTORY FROM: pt and chart  Today we had the pleasure of seeing Nitin Holverson in follow-up at our Neurology Clinic. Pt was accompanied by no one.   History Summary [name] is a 45 y.o. male with no significant PMH was admitted on 08/13/15 for acute onset of weakness and numbness involving her left hand and forearm. 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 MRA negative. CUS, LE DVT and TTE unremarkable. TEE showed small PFO and TCD bubble study showed small PFO with spencer degree II at rest and at least III with valsalva. Hypercoagulable work up all negative. LDL 83 and A1C 5.8. He was put on ASA 325mg  and lipitor 10mg .   Interval History During the interval time, the patient has been doing well. Symptoms all resolved. No recurrent stroke symptoms. He discussed with [facility] cardiology and currently not favor for PFO closure. He is interested in RESPECT ESUS trial. Will give him more information. Refill the ASA and lipitor.    REVIEW OF SYSTEMS: Full 14 system review of systems performed and notable only for those listed below and in HPI above, all others are negative:  Constitutional:   Cardiovascular:  Ear/Nose/Throat:   Skin:  Eyes:   Respiratory:   Gastroitestinal:   Genitourinary:  Hematology/Lymphatic:   Endocrine:  Musculoskeletal:   Allergy/Immunology:   Neurological:   Psychiatric:  Sleep:   The following represents the patient's updated allergies and side effects list: No Known Allergies  The neurologically relevant items on the patient's problem list were reviewed on today's visit.  Neurologic Examination  A problem focused neurological exam (12 or more points of the single system neurologic examination, vital signs counts as 1 point, cranial nerves count for 8 points) was performed.  Blood pressure 122/88,  pulse 73, height 5\' 11"  (1.803 m), weight 206 lb 6.4 oz (93.622 kg).  General - Well nourished, well developed, in no apparent distress .  Ophthalmologic - Sharp disc margins OU . Fundi not visualized due to  .  Cardiovascular - Regular rate and rhythm with no murmur .  Mental Status -  Level of arousal and orientation to time, place, and person were intact. Language including expression, naming, repetition, comprehension was assessed and found intact. Attention span and concentration were normal. Recent and remote memory were intact. Fund of Knowledge was assessed and was intact.  Cranial Nerves II - XII - II - Visual field intact OU. III, IV, VI - Extraocular movements intact. V - Facial sensation intact bilaterally. VII - Facial movement intact bilaterally. VIII - Hearing & vestibular intact bilaterally. X - Palate elevates symmetrically. XI - Chin turning & shoulder shrug intact bilaterally. XII - Tongue protrusion intact.  Motor Strength - The patient's strength was normal in all extremities and pronator drift was absent.  Bulk was normal and fasciculations were absent.   Motor Tone - Muscle tone was assessed at the neck and appendages and was normal.  Reflexes - The patient's reflexes were 1+ in all extremities and he had no pathological reflexes.  Sensory - Light touch, temperature/pinprick, vibration and proprioception, and Romberg testing were assessed and were normal.    Coordination - The patient had normal movements in the hands and feet with no ataxia or dysmetria.  Tremor was absent.  Gait and Station - The patient's transfers, posture, gait, station, and turns  were observed as normal.   Functional score  mRS = 0   0 - No symptoms.   1 - No significant disability. Able to carry out all usual activities, despite some symptoms.   2 - Slight disability. Able to look after own affairs without assistance, but unable to carry out all previous activities.    3 - Moderate disability. Requires some help, but able to walk unassisted.   4 - Moderately severe disability. Unable to attend to own bodily needs without assistance, and unable to walk unassisted.   5 - Severe disability. Requires constant nursing care and attention, bedridden, incontinent.   6 - Dead.   NIH Stroke Scale = 0  Data reviewed: I personally reviewed the images and agree with the radiology interpretations.  Chest 2 View 08/14/2015  No active cardiopulmonary disease.   Ct Head Wo Contrast 08/14/2015  No acute intracranial hemorrhage.   Mr Brain Wo Contrast 08/13/2015  1. 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  2. No associated hemorrhage or mass effect.  3. Elsewhere normal noncontrast MRI appearance of the brain.   Mr Jodene Nam Head/brain Wo Cm 08/14/2015  Negative MRA head.   2D Echocardiogram  - Left ventricle: The cavity size was normal. Wall thickness was increased in a pattern of mild LVH. Systolic function was normal. The estimated ejection fraction was in the range of 55% to 60%. Wall motion was normal; there were no regional wall motion abnormalities. Left ventricular diastolic function parameters were normal. - Atrial septum: No defect or patent foramen ovale was identified. Impressions: No cardiac source of emboli was indentified.  LE Venous Doppler There is no DVT or SVT noted in the bilateral lower extremities  TCD bubble study - small PFO with spencer degree II at rest and at least III with valsalva  TEE - Normal LV size and systolic function, EF 0000000. Normal RV size and systolic function. Trivial TR, trivial MR. Trileaflet aortic valve with no stenosis or regurgitation. Normal left atrial size, no LA appendage thrombus. Normal right atrial size. There was a small PFO  visible by color doppler and bubble study. Mildly dilated aortic root at 4.1 cm. No significant thoracic aorta plaque.    Component     Latest Ref Rng 08/14/2015 08/15/2015  Cholesterol     0 - 200 mg/dL 145   Triglycerides     <150 mg/dL 55   HDL Cholesterol     >40 mg/dL 51   Total CHOL/HDL Ratio      2.8   VLDL     0 - 40 mg/dL 11   LDL (calc)     0 - 99 mg/dL 83   Opiates     NONE DETECTED  NONE DETECTED  COCAINE     NONE DETECTED  NONE DETECTED  Benzodiazepines     NONE DETECTED  POSITIVE (A)  Amphetamines     NONE DETECTED  NONE DETECTED  Tetrahydrocannabinol     NONE DETECTED  NONE DETECTED  Barbiturates     NONE DETECTED  NONE DETECTED  PTT Lupus Anticoagulant     0.0 - 43.6 sec 36.6   DRVVT     0.0 - 44.0 sec 38.9   Lupus Anticoag Interp      Comment:   Beta-2 Glycoprotein I Ab, IgG     0 - 20 GPI IgG  units <9   Beta-2-Glycoprotein I IgM     0 - 32 GPI IgM units <9   Beta-2-Glycoprotein I IgA     0 - 25 GPI IgA units <9   Anticardiolipin Ab,IgG,Qn     0 - 14 GPL U/mL <9   Anticardiolipin Ab,IgM,Qn     0 - 12 MPL U/mL <9   Anticardiolipin Ab,IgA,Qn     0 - 11 APL U/mL <9   Hemoglobin A1C     4.8 - 5.6 % 5.8 (H)   Mean Plasma Glucose      120   Recommendations-F5LEID:      Comment   Comment      Comment   Recommendations-PTGENE:      Comment   Additional Information      Comment   Antithrombin Activity     75 - 120 % 119   Protein C-Functional     73 - 180 % 139   Protein C, Total     60 - 150 % 112   Protein S-Functional     63 - 140 % 111   Protein S, Total     60 - 150 % 118   Homocysteine     0.0 - 15.0 umol/L 13.8   TSH     0.350 - 4.500 uIU/mL  1.194  Vitamin B12     180 - 914 pg/mL  304  RPR     Non Reactive  Non Reactive  HIV     Non Reactive  Non Reactive  Folate     >5.9 ng/mL  14.2  ds DNA Ab     0 - 9 IU/mL  <1  ANA Ab, IFA       Negative  Sed Rate     0 - 16 mm/hr  5  CRP     <1.0 mg/dL  0.8      Assessment: As you may recall, he is a 45 y.o. Mediterranean male with no significant PMH was admitted on 08/13/15 for acute onset of weakness and numbness involving her left hand and forearm. 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 MRA negative. CUS, LE DVT and TTE unremarkable. TEE showed small PFO and TCD bubble study showed small PFO with spencer degree II at rest and at least III with valsalva. Hypercoagulable work up all negative. LDL 83 and A1C 5.8. He was put on ASA 325mg  and lipitor 10mg . During the interval time, he is doing well. No recurrent symptoms. On ASA and lipitor. Declined PFO closure and interested in RESPECT ESUS trial.  Plan:  - continue ASA and lipitor for stroke prevention - may consider to take off lipitor later - please consider research study for stroke prevention. Will give you more information about the study - Follow up with your primary care physician for stroke risk factor modification. Recommend maintain blood pressure goal <130/80, diabetes with hemoglobin A1c goal below 6.5% and lipids with LDL cholesterol goal below 70 mg/dL.  - check BP periodically - healthy diet and regular exercise. - follow up in 3 months.   I spent more than 25 minutes of face to face time with the patient. Greater than 50% of time was spent in counseling and coordination of care. We reviewed lab results, discussed about PFO closure vs. RESPECT ESUS trial.   No orders of the defined types were placed in this encounter.    Meds ordered this encounter  Medications  .  aspirin 325 MG EC tablet    Sig: Take 1 tablet (325 mg total) by mouth daily.    Dispense:  90 tablet    Refill:  3  . atorvastatin (LIPITOR) 10 MG tablet    Sig: Take 1 tablet (10 mg total) by mouth daily at 6 PM.    Dispense:  90 tablet    Refill:  1    Patient Instructions  - continue ASA and lipitor for stroke prevention - may consider to take off  lipitor later - please consider research study for stroke prevention. Will give you more information about the study - Follow up with your primary care physician for stroke risk factor modification. Recommend maintain blood pressure goal <130/80, diabetes with hemoglobin A1c goal below 6.5% and lipids with LDL cholesterol goal below 70 mg/dL.  - check BP periodically - healthy diet and regular exercise. - follow up in 3 months.      Rosalin Hawking, MD PhD [facility] [address], [place], [place] [address] [phone]

## 2015-11-10 DIAGNOSIS — M25561 Pain in right knee: Secondary | ICD-10-CM | POA: Diagnosis not present

## 2015-12-27 ENCOUNTER — Ambulatory Visit: Payer: BLUE CROSS/BLUE SHIELD | Admitting: Neurology

## 2016-01-12 ENCOUNTER — Ambulatory Visit: Payer: BLUE CROSS/BLUE SHIELD | Admitting: Neurology

## 2016-02-09 ENCOUNTER — Encounter: Payer: Self-pay | Admitting: Neurology

## 2016-02-09 ENCOUNTER — Ambulatory Visit (INDEPENDENT_AMBULATORY_CARE_PROVIDER_SITE_OTHER): Payer: BLUE CROSS/BLUE SHIELD | Admitting: Neurology

## 2016-02-09 VITALS — BP 120/85 | HR 62 | Ht 71.0 in | Wt 210.4 lb

## 2016-02-09 DIAGNOSIS — I63411 Cerebral infarction due to embolism of right middle cerebral artery: Secondary | ICD-10-CM

## 2016-02-09 DIAGNOSIS — Q211 Atrial septal defect: Secondary | ICD-10-CM

## 2016-02-09 DIAGNOSIS — Q2112 Patent foramen ovale: Secondary | ICD-10-CM

## 2016-02-09 NOTE — Patient Instructions (Signed)
-   continue ASA for stroke prevention - discontinue lipitor - Follow up with your primary care physician for stroke risk factor modification. Recommend maintain blood pressure goal <130/80, diabetes with hemoglobin A1c goal below 6.5% and lipids with LDL cholesterol goal below 70 mg/dL.  - check BP periodically - healthy diet and regular exercise. - follow up in 6 months.

## 2016-02-09 NOTE — Progress Notes (Signed)
STROKE NEUROLOGY FOLLOW UP NOTE  NAME: Aaron [name] DOB: 1971/02/21  REASON FOR VISIT: stroke follow up HISTORY FROM: pt and chart  Today we had the pleasure of seeing Aaron [name] in follow-up at our Neurology Clinic. Pt was accompanied by no one.   History Summary Aaron [name] is a 45 y.o. male with no significant PMH was admitted on 08/13/15 for acute onset of weakness and numbness involving her left hand and forearm. 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 MRA negative. CUS, LE DVT and TTE unremarkable. TEE showed small PFO and TCD bubble study showed small PFO with spencer degree II at rest and at least III with valsalva. Hypercoagulable work up all negative. LDL 83 and A1C 5.8. He was put on ASA 325mg  and lipitor 10mg .   09/21/15 follow up - the patient has been doing well. Symptoms all resolved. No recurrent stroke symptoms. He discussed with [facility]ology and currently not favor for PFO closure. He is interested in RESPECT ESUS trial. Will give him more information. Refill the ASA and lipitor.   Interval History During the interval time, pt refused RESPECT ESUS trial. Otherwise, he is doing well without any symptoms. BP stable today 120/85. He has no complains.   REVIEW OF SYSTEMS: Full 14 system review of systems performed and notable only for those listed below and in HPI above, all others are negative:  Constitutional:   Cardiovascular:  Ear/Nose/Throat:   Skin:  Eyes:   Respiratory:   Gastroitestinal:   Genitourinary:  Hematology/Lymphatic:   Endocrine: heat intolerance Musculoskeletal:   Allergy/Immunology:   Neurological:   Psychiatric:  Sleep:   The following represents the patient's updated allergies and side effects list: No Known Allergies  The neurologically relevant items on the patient's problem list were reviewed on today's visit.  Neurologic Examination  A problem focused neurological exam (12 or  more points of the single system neurologic examination, vital signs counts as 1 point, cranial nerves count for 8 points) was performed.  Blood pressure 120/85, pulse 62, height 5\' 11"  (1.803 m), weight 210 lb 6.4 oz (95.4 kg).  General - Well nourished, well developed, in no apparent distress  Ophthalmologic - Sharp disc margins OU  Cardiovascular - Regular rate and rhythm with no murmur  Mental Status -  Level of arousal and orientation to time, place, and person were intact. Language including expression, naming, repetition, comprehension was assessed and found intact. Attention span and concentration were normal. Recent and remote memory were intact. Fund of Knowledge was assessed and was intact.  Cranial Nerves II - XII - II - Visual field intact OU. III, IV, VI - Extraocular movements intact. V - Facial sensation intact bilaterally. VII - Facial movement intact bilaterally. VIII - Hearing & vestibular intact bilaterally. X - Palate elevates symmetrically. XI - Chin turning & shoulder shrug intact bilaterally. XII - Tongue protrusion intact.  Motor Strength - The patient's strength was normal in all extremities and pronator drift was absent.  Bulk was normal and fasciculations were absent.   Motor Tone - Muscle tone was assessed at the neck and appendages and was normal.  Reflexes - The patient's reflexes were 1+ in all extremities and he had no pathological reflexes.  Sensory - Light touch, temperature/pinprick, vibration and proprioception, and Romberg testing were assessed and were normal.    Coordination - The patient had normal movements in the hands and feet with no ataxia or dysmetria.  Tremor was absent.  Gait and Station - The patient's transfers, posture, gait, station, and turns were observed as normal.   Data reviewed: I personally reviewed the images and agree with the radiology interpretations.  Chest 2 View 08/14/2015  No active cardiopulmonary  disease.   Ct Head Wo Contrast 08/14/2015  No acute intracranial hemorrhage.   Mr Brain Wo Contrast 08/13/2015  1. 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  2. No associated hemorrhage or mass effect.  3. Elsewhere normal noncontrast MRI appearance of the brain.   Mr Jodene Nam Head/brain Wo Cm 08/14/2015  Negative MRA head.   2D Echocardiogram  - Left ventricle: The cavity size was normal. Wall thickness was increased in a pattern of mild LVH. Systolic function was normal. The estimated ejection fraction was in the range of 55% to 60%. Wall motion was normal; there were no regional wall motion abnormalities. Left ventricular diastolic function parameters were normal. - Atrial septum: No defect or patent foramen ovale was identified. Impressions: No cardiac source of emboli was indentified.  LE Venous Doppler There is no DVT or SVT noted in the bilateral lower extremities  TCD bubble study - small PFO with spencer degree II at rest and at least III with valsalva  TEE - Normal LV size and systolic function, EF 0000000. Normal RV size and systolic function. Trivial TR, trivial MR. Trileaflet aortic valve with no stenosis or regurgitation. Normal left atrial size, no LA appendage thrombus. Normal right atrial size. There was a small PFO visible by color doppler and bubble study. Mildly dilated aortic root at 4.1 cm. No significant thoracic aorta plaque.    Component     Latest Ref Rng 08/14/2015 08/15/2015  Cholesterol     0 - 200 mg/dL 145   Triglycerides     <150 mg/dL 55   HDL Cholesterol     >40 mg/dL 51   Total CHOL/HDL Ratio      2.8   VLDL     0 - 40 mg/dL 11   LDL (calc)     0 - 99 mg/dL 83   Opiates     NONE DETECTED  NONE DETECTED  COCAINE     NONE DETECTED  NONE DETECTED  Benzodiazepines      NONE DETECTED  POSITIVE (A)  Amphetamines     NONE DETECTED  NONE DETECTED  Tetrahydrocannabinol     NONE DETECTED  NONE DETECTED  Barbiturates     NONE DETECTED  NONE DETECTED  PTT Lupus Anticoagulant     0.0 - 43.6 sec 36.6   DRVVT     0.0 - 44.0 sec 38.9   Lupus Anticoag Interp      Comment:   Beta-2 Glycoprotein I Ab, IgG     0 - 20 GPI IgG units <9   Beta-2-Glycoprotein I IgM     0 - 32 GPI IgM units <9   Beta-2-Glycoprotein I IgA     0 - 25 GPI IgA units <9   Anticardiolipin Ab,IgG,Qn     0 - 14 GPL U/mL <9   Anticardiolipin Ab,IgM,Qn     0 - 12 MPL U/mL <9   Anticardiolipin Ab,IgA,Qn     0 - 11 APL U/mL <9   Hemoglobin A1C     4.8 - 5.6 % 5.8 (H)   Mean Plasma Glucose  120   Recommendations-F5LEID:      Comment   Comment      Comment   Recommendations-PTGENE:      Comment   Additional Information      Comment   Antithrombin Activity     75 - 120 % 119   Protein C-Functional     73 - 180 % 139   Protein C, Total     60 - 150 % 112   Protein S-Functional     63 - 140 % 111   Protein S, Total     60 - 150 % 118   Homocysteine     0.0 - 15.0 umol/L 13.8   TSH     0.350 - 4.500 uIU/mL  1.194  Vitamin B12     180 - 914 pg/mL  304  RPR     Non Reactive  Non Reactive  HIV     Non Reactive  Non Reactive  Folate     >5.9 ng/mL  14.2  ds DNA Ab     0 - 9 IU/mL  <1  ANA Ab, IFA       Negative  Sed Rate     0 - 16 mm/hr  5  CRP     <1.0 mg/dL  0.8     Assessment: As you may recall, he is a 45 y.o. Mediterranean male with no significant PMH was admitted on 08/13/15 for acute onset of weakness and numbness involving her left hand and forearm. 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 MRA negative. CUS, LE DVT and TTE unremarkable. TEE showed small PFO and TCD bubble study showed small PFO with spencer degree II at rest and at least III with valsalva. Hypercoagulable work up all negative. LDL 83 and  A1C 5.8. He was put on ASA 325mg  and lipitor 10mg . During the interval time, he is doing well. No recurrent symptoms. On ASA and discontinued lipitor. Declined PFO closure and RESPECT ESUS trial.   Plan:  - continue ASA for stroke prevention - discontinue lipitor - Follow up with your primary care physician for stroke risk factor modification. Recommend maintain blood pressure goal <130/80, diabetes with hemoglobin A1c goal below 6.5% and lipids with LDL cholesterol goal below 70 mg/dL.  - check BP periodically - healthy diet and regular exercise. - follow up in 6 months.  No orders of the defined types were placed in this encounter.   Meds ordered this encounter  Medications  . DISCONTD: atorvastatin (LIPITOR) 10 MG tablet  . DISCONTD: folic acid (FOLVITE) 1 MG tablet  . DISCONTD: PENNSAID 2 % SOLN    Sig: APPLY 2 PUMPS (2 GRAMS) TO AFFECTED AREA TOPICALLY TWICE DAILY AS DIRECTED    Refill:  0  . DISCONTD: Diclofenac Sodium 2 % SOLN    Sig: Apply 2 pumps topically 2 (two) times daily.    Patient Instructions  - continue ASA for stroke prevention - discontinue lipitor - Follow up with your primary care physician for stroke risk factor modification. Recommend maintain blood pressure goal <130/80, diabetes with hemoglobin A1c goal below 6.5% and lipids with LDL cholesterol goal below 70 mg/dL.  - check BP periodically - healthy diet and regular exercise. - follow up in 6 months.   Rosalin Hawking, MD PhD [facility] [address] [place], [place] [address] [phone]

## 2016-08-06 ENCOUNTER — Ambulatory Visit: Payer: BLUE CROSS/BLUE SHIELD | Admitting: Neurology

## 2016-11-26 ENCOUNTER — Ambulatory Visit: Payer: BLUE CROSS/BLUE SHIELD | Admitting: Neurology

## 2016-11-26 ENCOUNTER — Telehealth: Payer: Self-pay

## 2016-11-26 NOTE — Telephone Encounter (Signed)
PT no show for follow up appt.

## 2016-11-27 ENCOUNTER — Encounter: Payer: Self-pay | Admitting: Neurology

## 2017-01-02 DIAGNOSIS — Z0289 Encounter for other administrative examinations: Secondary | ICD-10-CM

## 2017-02-11 ENCOUNTER — Other Ambulatory Visit: Payer: Self-pay | Admitting: Neurology

## 2017-02-11 DIAGNOSIS — I63411 Cerebral infarction due to embolism of right middle cerebral artery: Secondary | ICD-10-CM

## 2017-02-18 ENCOUNTER — Ambulatory Visit (INDEPENDENT_AMBULATORY_CARE_PROVIDER_SITE_OTHER): Payer: BLUE CROSS/BLUE SHIELD | Admitting: Neurology

## 2017-02-18 ENCOUNTER — Encounter: Payer: Self-pay | Admitting: Neurology

## 2017-02-18 VITALS — BP 113/78 | HR 76 | Wt 202.6 lb

## 2017-02-18 DIAGNOSIS — Q211 Atrial septal defect: Secondary | ICD-10-CM

## 2017-02-18 DIAGNOSIS — I63411 Cerebral infarction due to embolism of right middle cerebral artery: Secondary | ICD-10-CM | POA: Diagnosis not present

## 2017-02-18 DIAGNOSIS — Q2112 Patent foramen ovale: Secondary | ICD-10-CM

## 2017-02-18 NOTE — Patient Instructions (Addendum)
-   continue ASA for stroke prevention - Follow up with your primary care physician for stroke risk factor modification. Recommend maintain blood pressure goal <130/80, diabetes with hemoglobin A1c goal below 6.5% and lipids with LDL cholesterol goal below 70 mg/dL.  - you may get further opinion from [facility] cardiology or neurology regarding PFO. If you will, we can also refer you to see [facility] cardiology. Please let us know.  - check BP periodically - healthy diet and regular exercise. - follow up as needed.

## 2017-02-18 NOTE — Progress Notes (Signed)
STROKE Aaron [name] FOLLOW UP NOTE  NAME: Aaron Aaron [name] DOB: 19-Apr-1971  REASON FOR VISIT: stroke follow up HISTORY FROM: pt and chart  Today we had the pleasure of seeing Aaron Aaron [name] in follow-up at [name]. Pt was accompanied by wife.   History Summary Aaron Aaron [name] is a 46 y.o. male with no significant PMH was admitted on 08/13/15 for acute onset of weakness and numbness involving her left hand and forearm. 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 MRA negative. CUS, LE DVT and TTE unremarkable. TEE showed small PFO and TCD bubble study showed small PFO with spencer degree II at rest and at least III with valsalva. Hypercoagulable work up all negative. LDL 83 and A1C 5.8. He was put on ASA 325mg  and lipitor 10mg .   09/21/15 follow up - the patient has been doing well. Symptoms all resolved. No recurrent stroke symptoms. He discussed with [name] and currently not favor for PFO closure. He is interested in RESPECT ESUS trial. Will give him more information. Refill the ASA and lipitor.   02/09/16 follow up - pt refused RESPECT ESUS trial. Otherwise, he is doing well without any symptoms. BP stable today 120/85. He has no complains.   Interval History During the interval time, pt has been doing well. Still on ASA. No recurrent stroke like symptoms. BP 113/78. Informed him that recent several clinical trials support PFO closure for cyptogenic stroke with PFO and no other stroke risk factors. I encouraged him to discuss with [name] (where he works at) or [name] for more information.   REVIEW OF SYSTEMS: Full 14 system review of systems performed and notable only for those listed below and in HPI above, all others are negative:  Constitutional:   Cardiovascular:  Ear/Nose/Throat:   Skin:  Eyes:   Respiratory:   Gastroitestinal:   Genitourinary:  Hematology/Lymphatic:   Endocrine:  Musculoskeletal:     Allergy/Immunology:   Neurological:   Psychiatric:  Sleep:   The following represents the patient's updated allergies and side effects list: No Known Allergies  The neurologically relevant items on the patient's problem list were reviewed on today's visit.  Neurologic Examination  A problem focused neurological exam (12 or more points of the single system neurologic examination, vital signs counts as 1 point, cranial nerves count for 8 points) was performed.  Blood pressure 113/78, pulse 76, weight 202 lb 9.6 oz (91.9 kg).  General - Well nourished, well developed, in no apparent distress  Ophthalmologic - Sharp disc margins OU  Cardiovascular - Regular rate and rhythm with no murmur  Mental Status -  Level of arousal and orientation to time, place, and person were intact. Language including expression, naming, repetition, comprehension was assessed and found intact. Attention span and concentration were normal. Recent and remote memory were intact. Fund of Knowledge was assessed and was intact.  Cranial Nerves II - XII - II - Visual field intact OU. III, IV, VI - Extraocular movements intact. V - Facial sensation intact bilaterally. VII - Facial movement intact bilaterally. VIII - Hearing & vestibular intact bilaterally. X - Palate elevates symmetrically. XI - Chin turning & shoulder shrug intact bilaterally. XII - Tongue protrusion intact.  Motor Strength - The patient's strength was normal in all extremities and pronator drift was absent.  Bulk was normal and fasciculations were absent.   Motor Tone - Muscle tone was assessed at the neck and appendages and was normal.  Reflexes - The patient's reflexes were 1+ in all extremities and he had no pathological reflexes.  Sensory - Light touch, temperature/pinprick, vibration and proprioception, and Romberg testing were assessed and were normal.    Coordination - The patient had normal movements in the hands and feet with  no ataxia or dysmetria.  Tremor was absent.  Gait and Station - The patient's transfers, posture, gait, station, and turns were observed as normal.   Data reviewed: I personally reviewed the images and agree with the radiology interpretations.  Chest 2 View 08/14/2015  No active cardiopulmonary disease.   Ct Head Wo Contrast 08/14/2015  No acute intracranial hemorrhage.   Mr Brain Wo Contrast 08/13/2015  1. 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  2. No associated hemorrhage or mass effect.  3. Elsewhere normal noncontrast MRI appearance of the brain.   Mr Aaron Aaron [name] Head/brain Wo Cm 08/14/2015  Negative MRA head.   2D Echocardiogram  - Left ventricle: The cavity size was normal. Wall thickness was increased in a pattern of mild LVH. Systolic function was normal. The estimated ejection fraction was in the range of 55% to 60%. Wall motion was normal; there were no regional wall motion abnormalities. Left ventricular diastolic function parameters were normal. - Atrial septum: No defect or patent foramen ovale was identified. Impressions: No cardiac source of emboli was indentified.  LE Venous Doppler There is no DVT or SVT noted in the bilateral lower extremities  TCD bubble study - small PFO with spencer degree II at rest and at least III with valsalva  TEE - Normal LV size and systolic function, EF 57-26%. Normal RV size and systolic function. Trivial TR, trivial MR. Trileaflet aortic valve with no stenosis or regurgitation. Normal left atrial size, no LA appendage thrombus. Normal right atrial size. There was a small PFO visible by color doppler and bubble study. Mildly dilated aortic root at 4.1 cm. No significant thoracic aorta plaque.    Component     Latest Ref Rng 08/14/2015 08/15/2015    Cholesterol     0 - 200 mg/dL 145   Triglycerides     <150 mg/dL 55   HDL Cholesterol     >40 mg/dL 51   Total CHOL/HDL Ratio      2.8   VLDL     0 - 40 mg/dL 11   LDL (calc)     0 - 99 mg/dL 83   Opiates     NONE DETECTED  NONE DETECTED  COCAINE     NONE DETECTED  NONE DETECTED  Benzodiazepines     NONE DETECTED  POSITIVE (A)  Amphetamines     NONE DETECTED  NONE DETECTED  Tetrahydrocannabinol     NONE DETECTED  NONE DETECTED  Barbiturates     NONE DETECTED  NONE DETECTED  PTT Lupus Anticoagulant     0.0 - 43.6 sec 36.6   DRVVT     0.0 - 44.0 sec 38.9   Lupus Anticoag Interp      Comment:   Beta-2 Glycoprotein I Ab, IgG     0 - 20 GPI IgG units <9   Beta-2-Glycoprotein I IgM     0 - 32 GPI IgM units <9   Beta-2-Glycoprotein I IgA     0 - 25 GPI IgA units <9   Anticardiolipin Ab,IgG,Qn  0 - 14 GPL U/mL <9   Anticardiolipin Ab,IgM,Qn     0 - 12 MPL U/mL <9   Anticardiolipin Ab,IgA,Qn     0 - 11 APL U/mL <9   Hemoglobin A1C     4.8 - 5.6 % 5.8 (H)   Mean Plasma Glucose      120   Recommendations-F5LEID:      Comment   Comment      Comment   Recommendations-PTGENE:      Comment   Additional Information      Comment   Antithrombin Activity     75 - 120 % 119   Protein C-Functional     73 - 180 % 139   Protein C, Total     60 - 150 % 112   Protein S-Functional     63 - 140 % 111   Protein S, Total     60 - 150 % 118   Homocysteine     0.0 - 15.0 umol/L 13.8   TSH     0.350 - 4.500 uIU/mL  1.194  Vitamin B12     180 - 914 pg/mL  304  RPR     Non Reactive  Non Reactive  HIV     Non Reactive  Non Reactive  Folate     >5.9 ng/mL  14.2  ds DNA Ab     0 - 9 IU/mL  <1  ANA Ab, IFA       Negative  Sed Rate     0 - 16 mm/hr  5  CRP     <1.0 mg/dL  0.8     Assessment: As you may recall, he is a 46 y.o. Mediterranean male with no significant PMH was admitted on 08/13/15 for acute onset of weakness and numbness involving her left hand and  forearm. 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 MRA negative. CUS, LE DVT and TTE unremarkable. TEE showed small PFO and TCD bubble study showed small PFO with spencer degree II at rest and at least III with valsalva. Hypercoagulable work up all negative. LDL 83 and A1C 5.8. He was put on ASA 325mg  and lipitor 10mg . During the interval time, he is doing well. No recurrent symptoms. On ASA and discontinued lipitor. Declined PFO closure and RESPECT ESUS trial. Informed him that recent several clinical trials support PFO closure for cyptogenic stroke with PFO and no other stroke risk factors. I encouraged him to discuss with Aaron Aaron [name] (where he works at) or [name] for more information.   Plan:  - continue ASA for stroke prevention - Follow up with your primary care physician for stroke risk factor modification. Recommend maintain blood pressure goal <130/80, diabetes with hemoglobin A1c goal below 6.5% and lipids with LDL cholesterol goal below 70 mg/dL.  - you may get further opinion from [facility] Aaron [name] Aaron [name] or Aaron [name] regarding PFO. If you will, we can also refer you to see [facility] Aaron [name]. Please let us know.  - check BP periodically - healthy diet and regular exercise. - follow up as needed.   No orders of the defined types were placed in this encounter.   No orders of the defined types were placed in this encounter.   Patient Instructions  - continue ASA for stroke prevention - Follow up with your primary care physician for stroke risk factor modification. Recommend maintain blood pressure goal <130/80, diabetes with hemoglobin A1c goal below 6.5% and  lipids with LDL cholesterol goal below 70 mg/dL.  - you may get further opinion from [facility] Aaron [name] Aaron [name] or Aaron [name] regarding PFO. If you will, we can also refer you to see [facility] Aaron [name]. Please let us know.  - check BP periodically - healthy diet and regular exercise. - follow  up as needed.    Rosalin Hawking, MD PhD [facility] [address], [place], [place] [address] [phone]

## 2017-03-01 ENCOUNTER — Ambulatory Visit (INDEPENDENT_AMBULATORY_CARE_PROVIDER_SITE_OTHER): Payer: BLUE CROSS/BLUE SHIELD | Admitting: Family Medicine

## 2017-03-01 ENCOUNTER — Encounter: Payer: Self-pay | Admitting: Family Medicine

## 2017-03-01 VITALS — BP 112/72 | HR 61 | Temp 98.1°F | Ht 70.5 in | Wt 205.5 lb

## 2017-03-01 DIAGNOSIS — E663 Overweight: Secondary | ICD-10-CM

## 2017-03-01 DIAGNOSIS — E7800 Pure hypercholesterolemia, unspecified: Secondary | ICD-10-CM

## 2017-03-01 DIAGNOSIS — I63411 Cerebral infarction due to embolism of right middle cerebral artery: Secondary | ICD-10-CM | POA: Diagnosis not present

## 2017-03-01 DIAGNOSIS — Z8673 Personal history of transient ischemic attack (TIA), and cerebral infarction without residual deficits: Secondary | ICD-10-CM

## 2017-03-01 DIAGNOSIS — Z7689 Persons encountering health services in other specified circumstances: Secondary | ICD-10-CM

## 2017-03-01 LAB — LIPID PANEL
CHOL/HDL RATIO: 3
Cholesterol: 164 mg/dL (ref 0–200)
HDL: 47.8 mg/dL (ref >39.00)
LDL Cholesterol: 103 mg/dL — ABNORMAL HIGH (ref 0–99)
NonHDL: 116.02
TRIGLYCERIDES: 67 mg/dL (ref 0.0–149.0)
VLDL: 13.4 mg/dL (ref 0.0–40.0)

## 2017-03-01 LAB — BASIC METABOLIC PANEL
BUN: 11 mg/dL (ref 6–23)
CHLORIDE: 109 meq/L (ref 96–112)
CO2: 26 meq/L (ref 19–32)
CREATININE: 1.11 mg/dL (ref 0.40–1.50)
Calcium: 8.7 mg/dL (ref 8.4–10.5)
GFR: 90.75 mL/min (ref >60.00)
Glucose, Bld: 109 mg/dL — ABNORMAL HIGH (ref 70–99)
POTASSIUM: 4.1 meq/L (ref 3.5–5.1)
Sodium: 141 mEq/L (ref 135–145)

## 2017-03-01 NOTE — Progress Notes (Signed)
Subjective:    Patient ID: Aaron [name], male    DOB: 16-Aug-1970, 46 y.o.   MRN: [id number]  HPI This is a 46 yo male who presents today to establish care. He his married with two daughters (17,14). 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 Likes to hang out with friends, runs twice a week. Does hot yoga. Stress manageable.   Had a minor stroke last year. No residual. Has small PFO. Sees Dr. Erlinda Hong in neurology. Saw last 02/18/17, per DR. Xu's note (copied)-- Assessment: As you may recall, he is a 46 y.o. Mediterranean male with no significant PMH was admitted on 08/13/15 for acute onset of weakness and numbness involving her left hand and forearm. 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 MRA negative. CUS, LE DVT and TTE unremarkable. TEE showed small PFO and TCD bubble study showed small PFO with spencer degree II at rest and at least III with valsalva. Hypercoagulable work up all negative. LDL 83 and A1C 5.8. He was put on ASA 325mg  and lipitor 10mg . During the interval time, he is doing well. No recurrent symptoms. On ASA and discontinued lipitor. Declined PFO closure and RESPECT ESUS trial. Informed him that recent several clinical trials support PFO closure for cyptogenic stroke with PFO and no other stroke risk factors. I encouraged him to discuss with [facility] (where he works at) or [facility] for more information.   Overweight- would like to lose a few pounds. Drinks soda 1x/ week, no sweet tea, brings lunch most days. Has sweet tooth.   Past Medical History:  Diagnosis Date  . Medical history non-contributory   . Stroke [facility])    Past Surgical History:  Procedure Laterality Date  . NO PAST SURGERIES    . TEE WITHOUT CARDIOVERSION N/A 08/16/2015   Procedure: TRANSESOPHAGEAL ECHOCARDIOGRAM (TEE);  Surgeon: Larey Dresser, MD;  Location: [facility] ENDOSCOPY;  Service: Cardiovascular;  Laterality: N/A;   Family  History  Problem Relation Age of Onset  . Stroke Maternal Grandmother   . Stroke Paternal Grandmother    Social History  Substance Use Topics  . Smoking status: Never Smoker  . Smokeless tobacco: Never Used  . Alcohol use No     Comment: occasionallly stop August 2017      Review of Systems  Constitutional: Negative for fatigue, fever and unexpected weight change.  Respiratory: Negative for cough and shortness of breath.   Cardiovascular: Negative for chest pain and leg swelling.  Gastrointestinal: Negative for abdominal pain, constipation and diarrhea.  Neurological: Negative for dizziness, light-headedness and headaches.  Psychiatric/Behavioral: Negative for dysphoric mood and sleep disturbance. The patient is not nervous/anxious.        Objective:   Physical Exam Physical Exam  Constitutional: Oriented to person, place, and time. He appears well-developed and well-nourished.  HENT:  Head: Normocephalic and atraumatic.  Eyes: Conjunctivae are normal.  Neck: Normal range of motion. Neck supple.  Cardiovascular: Normal rate, regular rhythm and normal heart sounds.   Pulmonary/Chest: Effort normal and breath sounds normal.  Musculoskeletal: Normal range of motion.  Neurological: Alert and oriented to person, place, and time.  Skin: Skin is warm and dry.  Psychiatric: Normal mood and affect. Behavior is normal. Judgment and thought content normal.  Vitals reviewed.  BP 112/72   Pulse 61   Temp 98.1 F (36.7 C) (Oral)   Ht 5' 10.5" (1.791 m)   Wt 205 lb 8  oz (93.2 kg)   SpO2 97%   BMI 29.07 kg/m  Wt Readings from Last 3 Encounters:  03/01/17 205 lb 8 oz (93.2 kg)  02/18/17 202 lb 9.6 oz (91.9 kg)  02/09/16 210 lb 6.4 oz (95.4 kg)       Assessment & Plan:  1. Encounter to establish care - Discussed and encouraged healthy lifestyle choices- adequate sleep, regular exercise, stress management and healthy food choices.  - follow up in 1 year for CPE  2.  Cerebrovascular accident (CVA) due to embolism of right middle cerebral artery ([place]) - continued follow up with neurology - Lipid panel - Basic metabolic panel  3. Overweight with body mass index (BMI) 25.0-29.9 - encouraged continued exercise, increase vegetable, lean protein intake, monitor portions and limit sweets/prepared/packaged foods   Clarene Reamer, FNP-BC  [facility]  03/01/2017 10:26 AM

## 2017-03-01 NOTE — Patient Instructions (Signed)
It was a pleasure to meet you today! I look forward to partnering with you for your health care needs  Please come back in one year for a complete physical exam

## 2017-03-04 MED ORDER — ATORVASTATIN CALCIUM 10 MG PO TABS: 10.0000 mg | ORAL_TABLET | Freq: Every day | ORAL | 3 refills | Status: DC

## 2017-03-04 NOTE — Addendum Note (Signed)
Addended by: Clarene Reamer B on: 03/04/2017 08:41 AM   Modules accepted: Orders

## 2017-04-01 ENCOUNTER — Encounter: Payer: BLUE CROSS/BLUE SHIELD | Admitting: Family Medicine

## 2017-05-21 IMAGING — MR MR HEAD W/O CM
8 of 12 series · 30 of 48 positions shown · non-contrast
Comparison: None.

CLINICAL DATA: 44-year-old male with left upper extremity weakness.
Tingling in the fingers. Tachypnea. Dizziness. Initial encounter.

EXAM:
MRI HEAD WITHOUT CONTRAST
TECHNIQUE: Multiplanar, multiecho pulse sequences of the brain and surrounding
structures were obtained without intravenous contrast.

[Series 3: DWI · axial · 3.0mm · 1.09mm/px · z∈[-23,+121]mm · 6 of 98 slices shown (1 of 4)]
[im 1/98]
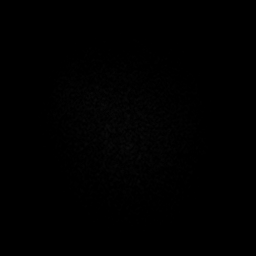
[im 20/98]
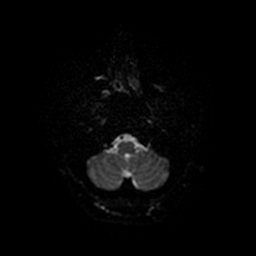
[im 39/98]
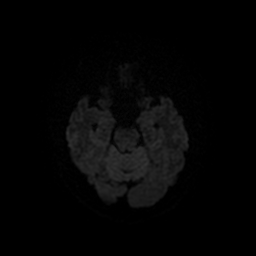
[im 59/98]
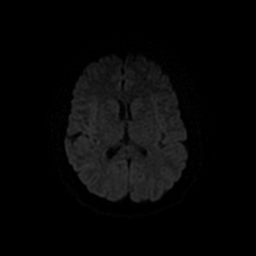
[im 78/98]
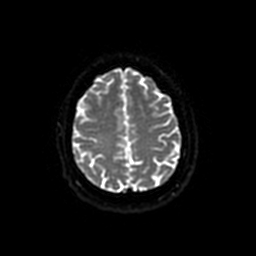
[im 98/98]
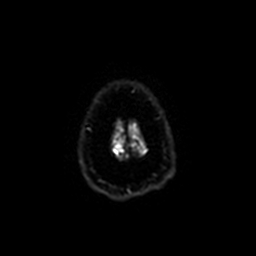

[Series 5: DWI · coronal · 5.0mm · 1.09mm/px · 5 of 68 slices shown (2 of 4)]
[im 1/68]
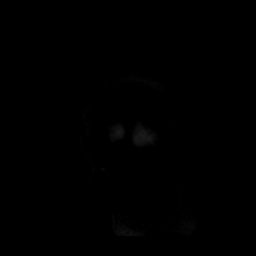
[im 17/68]
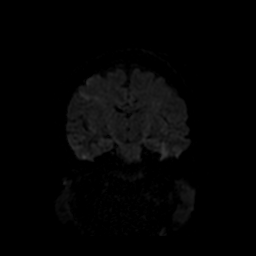
[im 34/68]
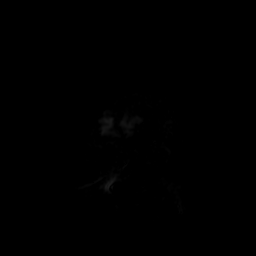
[im 51/68]
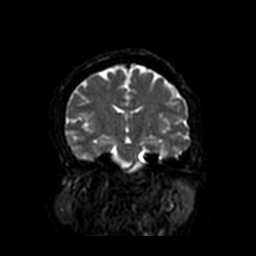
[im 68/68]
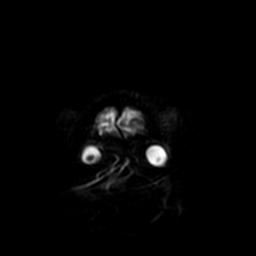

[Series 6: T2 · axial · 5.0mm · 0.43mm/px · z∈[-38,+111]mm · 2 of 24 slices shown (1 of 2)]
[im 1/24]
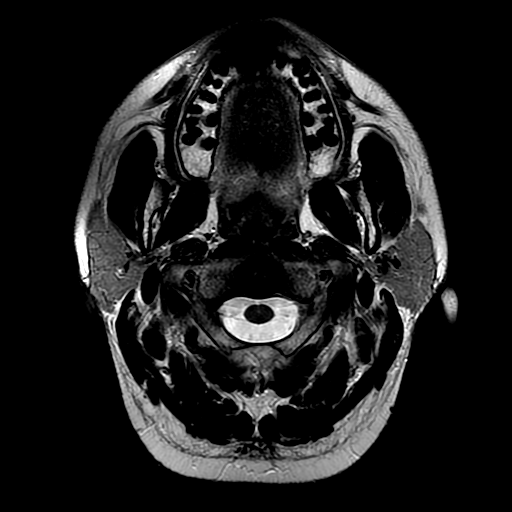
[im 24/24]
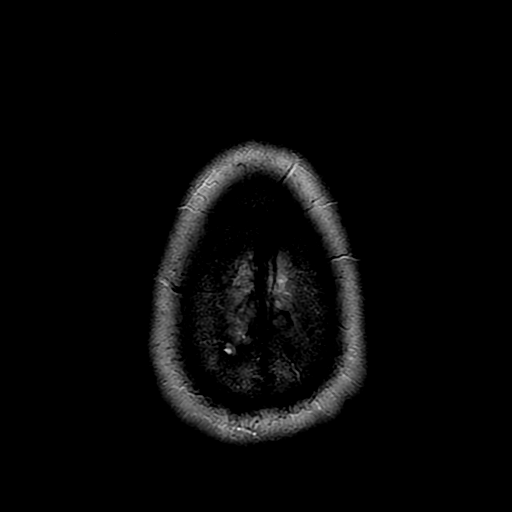

[Series 7: FLAIR · axial · 5.0mm · 0.43mm/px · z∈[-43,+117]mm · 2 of 24 slices shown (1 of 2)]
[im 1/24]
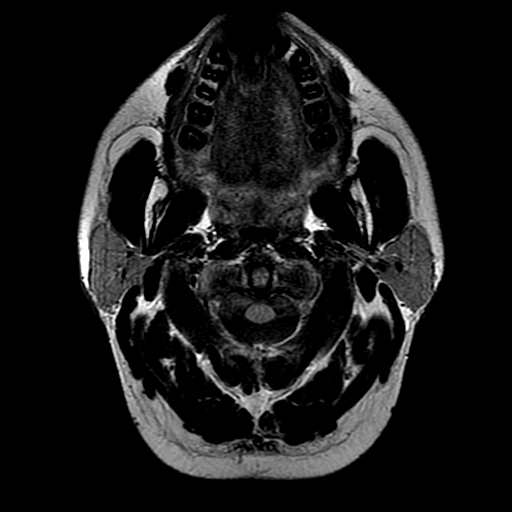
[im 24/24]
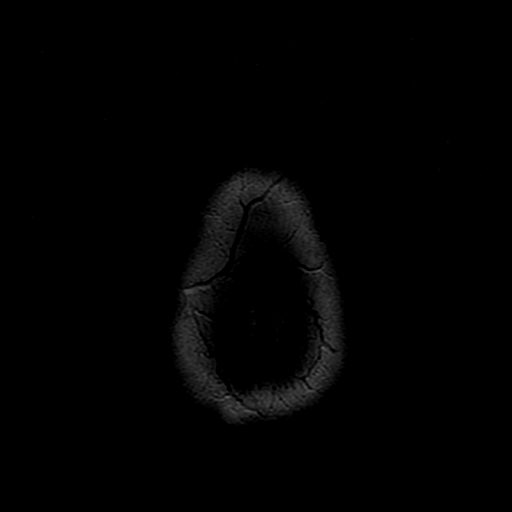

[Series 9: FLAIR · sagittal · 1.6mm · 0.47mm/px · 9 of 200 slices shown (2 of 2)]
[im 1/200]
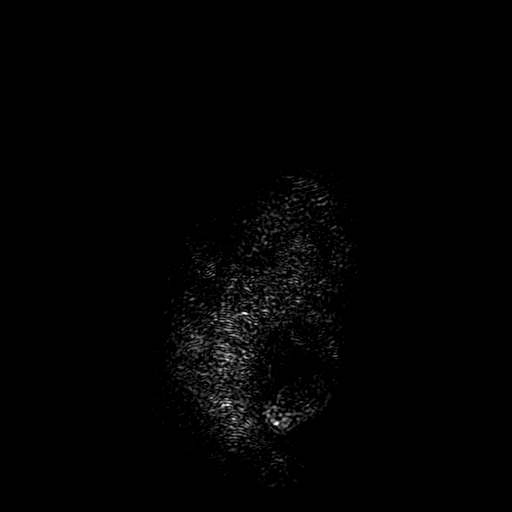
[im 34/200]
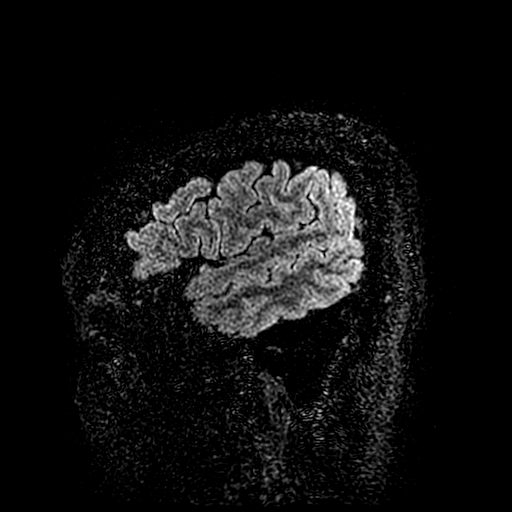
[im 67/200]
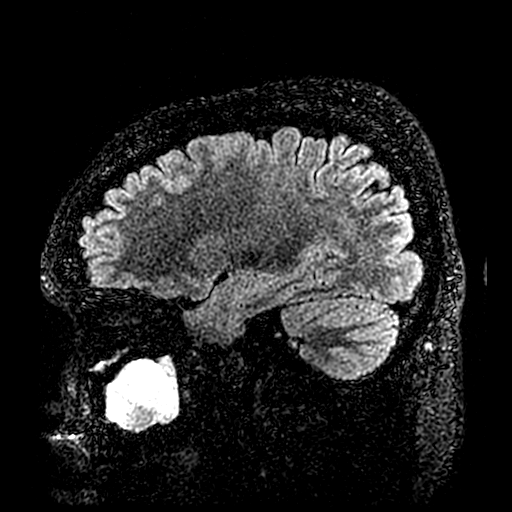
[im 83/200]
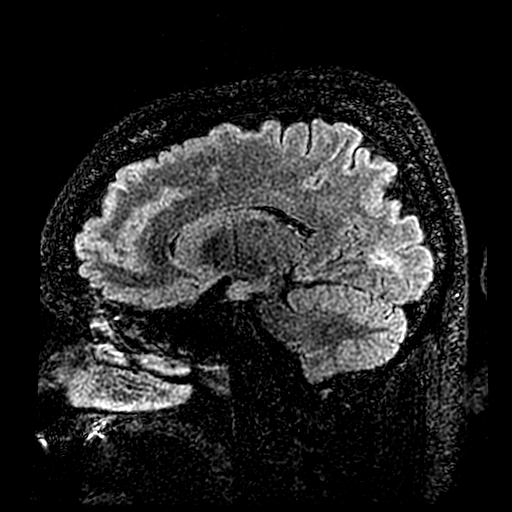
[im 100/200]
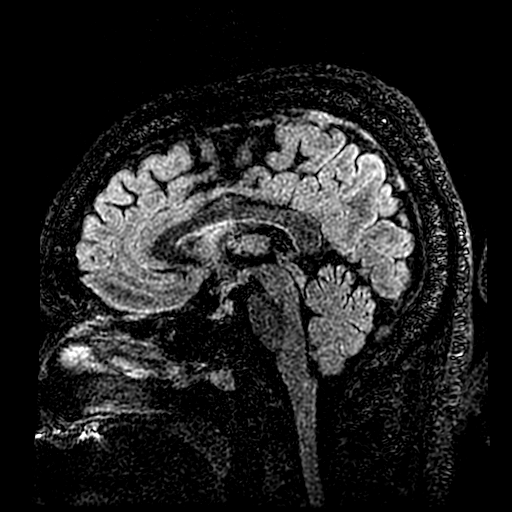
[im 117/200]
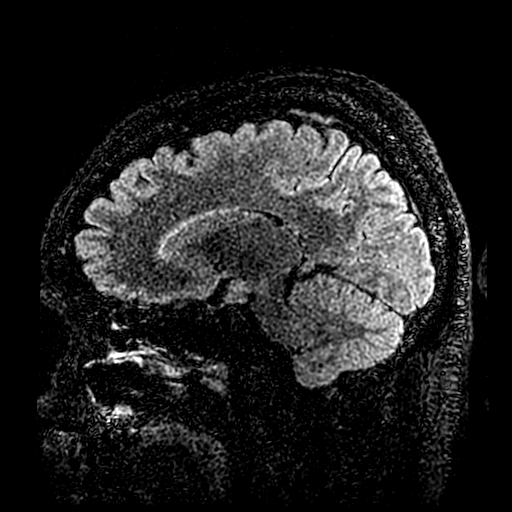
[im 133/200]
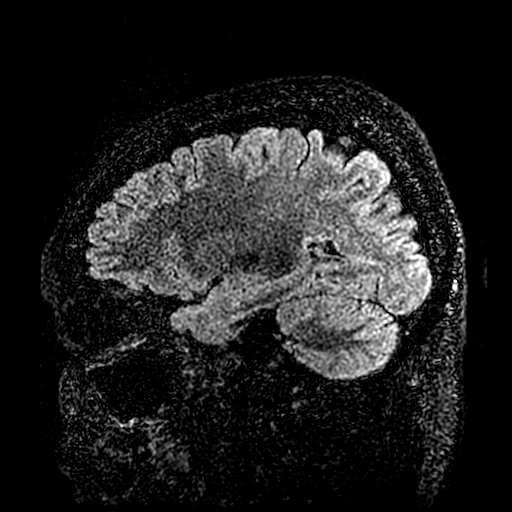
[im 166/200]
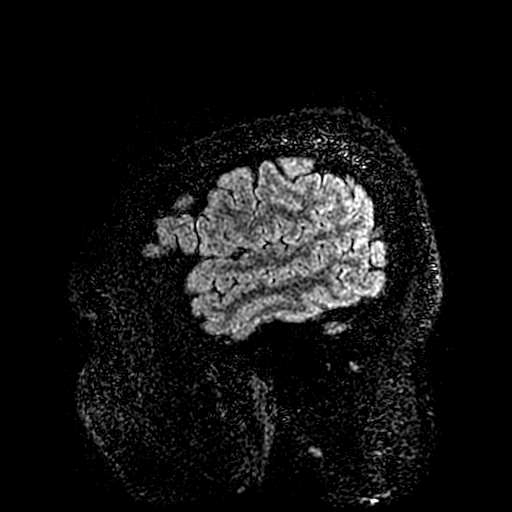
[im 200/200]
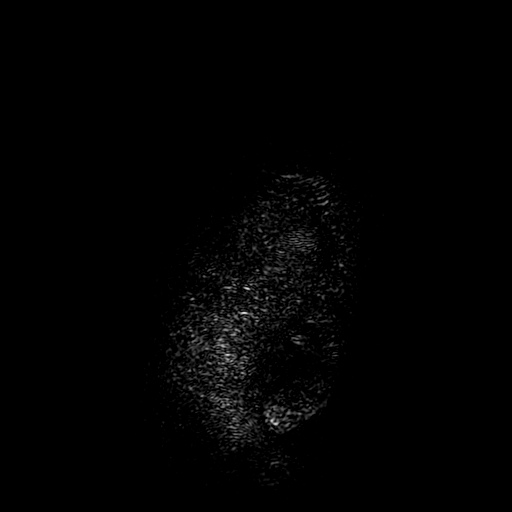

[Series 11: T2 · coronal · 5.0mm · 0.47mm/px · 1 of 24 slices shown (2 of 2)]
[im 1/24]
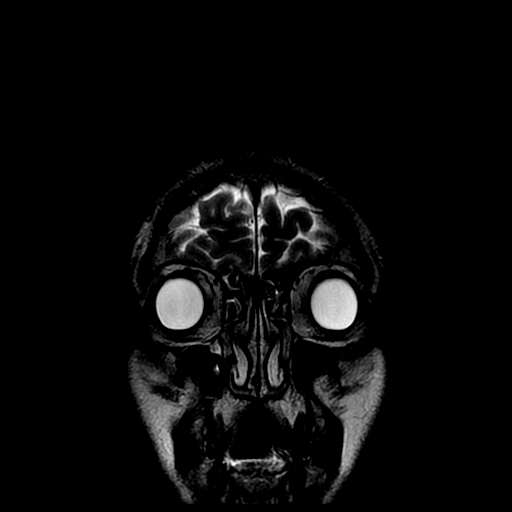

[Series 300: DWI · axial · 3.0mm · 1.09mm/px · z∈[-23,+121]mm · 3 of 49 slices shown (3 of 4)]
[im 1/49]
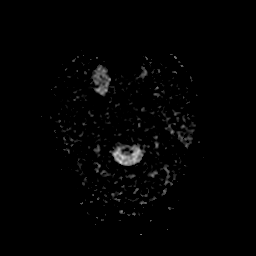
[im 25/49]
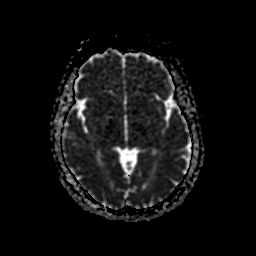
[im 49/49]
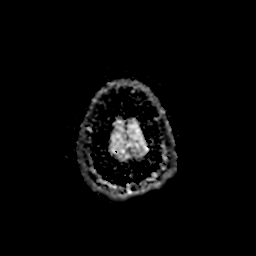

[Series 500: DWI · coronal · 5.0mm · 1.09mm/px · 2 of 34 slices shown (4 of 4)]
[im 1/34]
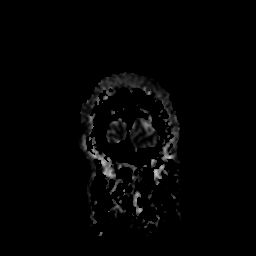
[im 34/34]
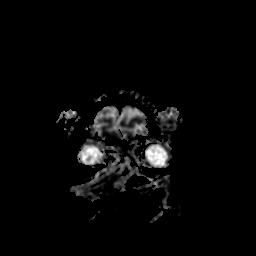

[30 of 48 positions shown; findings below may reference images not displayed]

FINDINGS: Major intracranial vascular flow voids are within normal limits.
There is a small focus of cortical restricted diffusion in the right
parietal lobe near the lateral peri-Rolandic cortex. There is more
subtle cortically based diffusion signal abnormality in the right
pre motor area near the left upper extremity motor representation
(series 3, image 39). This is associated with subtle cortical FLAIR
hyperintensity (series 7, image 20) with no associated hemorrhage or
mass effect.

No contralateral left hemisphere or posterior fossa restricted
diffusion. Elsewhere gray and white matter signal is within normal
limits for age throughout the brain.

No midline shift, mass effect, evidence of mass lesion,
ventriculomegaly, extra-axial collection or acute intracranial
hemorrhage. Cervicomedullary junction and pituitary are within
normal limits. Negative visualized cervical spine. Normal bone
marrow signal.

Visible internal auditory structures appear normal. Mastoids are
clear. Right maxillary subtotal opacification which appears related
to multiple mucous retention cysts. Small retention cyst in the left
maxillary sinus. Mild ethmoid sinus mucosal thickening. Negative
orbit and scalp soft tissues.
IMPRESSION: 1. Small/subtle signal abnormality in the right peri-Rolandic cortex
is most compatible with small acute cortical infarcts in the
posterior right MCA territory near the left upper extremity
representation area.
2. No associated hemorrhage or mass effect.
3. Elsewhere normal noncontrast MRI appearance of the brain.

## 2017-10-28 ENCOUNTER — Telehealth: Payer: Self-pay

## 2017-10-28 ENCOUNTER — Ambulatory Visit: Payer: BLUE CROSS/BLUE SHIELD | Admitting: Family Medicine

## 2017-10-28 NOTE — Telephone Encounter (Signed)
PLEASE NOTE: All timestamps contained within this report are represented as [place] Standard Time. CONFIDENTIALTY NOTICE: This fax transmission is intended only for the addressee. It contains information that is legally privileged, confidential or otherwise protected from use or disclosure. If you are not the intended recipient, you are strictly prohibited from reviewing, disclosing, copying using or disseminating any of this information or taking any action in reliance on or regarding this information. If you have received this fax in error, please notify us immediately by telephone so that we can arrange for its return to [place]. Phone: [phone], Toll-Free: [phone], Fax: [phone] Page: 1 of 1 Call Id: [id number] [place] Night - Client Nonclinical Telephone Record [place] Night - Client Client Site [facility] - PHYSICIAN, Verita Schneiders- MD Contact Type Call Who Is Calling Patient / Member / Family / Caregiver Caller Name [place] Phone Number [phone] Patient Name Aaron [name] Patient DOB 1971-02-07 Call Type Message Only Information Provided Reason for Call Request to Reschedule Office Appointment Initial Comment Caller states has appointment 6/10 at 8:00AM needs to be rescheduled Additional Comment Call Closed By: Margaretmary Bayley Transaction Date/Time: 10/26/2017 12:14:16 PM (ET)

## 2017-11-01 ENCOUNTER — Ambulatory Visit: Payer: BLUE CROSS/BLUE SHIELD | Admitting: Family Medicine

## 2017-11-08 ENCOUNTER — Encounter: Payer: Self-pay | Admitting: Family Medicine

## 2017-11-08 ENCOUNTER — Ambulatory Visit: Payer: BLUE CROSS/BLUE SHIELD | Admitting: Family Medicine

## 2017-11-08 VITALS — BP 118/60 | HR 66 | Temp 97.9°F | Wt 198.5 lb

## 2017-11-08 DIAGNOSIS — E663 Overweight: Secondary | ICD-10-CM

## 2017-11-08 DIAGNOSIS — E7800 Pure hypercholesterolemia, unspecified: Secondary | ICD-10-CM

## 2017-11-08 DIAGNOSIS — I63411 Cerebral infarction due to embolism of right middle cerebral artery: Secondary | ICD-10-CM

## 2017-11-08 LAB — LIPID PANEL
CHOLESTEROL: 118 mg/dL (ref 0–200)
HDL: 47.4 mg/dL (ref >39.00)
LDL Cholesterol: 60 mg/dL (ref 0–99)
NONHDL: 70.45
Total CHOL/HDL Ratio: 2
Triglycerides: 54 mg/dL (ref 0.0–149.0)
VLDL: 10.8 mg/dL (ref 0.0–40.0)

## 2017-11-08 LAB — HEMOGLOBIN A1C: HEMOGLOBIN A1C: 6.1 % (ref 4.6–6.5)

## 2017-11-08 NOTE — Patient Instructions (Signed)
Good to see you today  Stop at the lab, I will notify you of results next week  Follow up in 1 year

## 2017-11-08 NOTE — Progress Notes (Signed)
   Subjective:    Patient ID: Aaron [name], male    DOB: Apr 01, 1971, 47 y.o.   MRN: [id number]  HPI This is a 47 yo male who presents today for follow up of CVA. He takes ASA 325 and atorvastatin 10 mg daily. His neurologist, Dr. Erlinda Hong, is not doing outpatient care anymore. He would like to transfer to someone closer to his home/work.  Has been doing well, watching diet. Little time for exercise with work schedule and commute, but walks a lot at work. Is going to start J[employer] soon.  Feeling well.   Past Medical History:  Diagnosis Date  . Medical history non-contributory   . Stroke [facility])    Past Surgical History:  Procedure Laterality Date  . NO PAST SURGERIES    . TEE WITHOUT CARDIOVERSION N/A 08/16/2015   Procedure: TRANSESOPHAGEAL ECHOCARDIOGRAM (TEE);  Surgeon: Larey Dresser, MD;  Location: [facility] ENDOSCOPY;  Service: Cardiovascular;  Laterality: N/A;   Family History  Problem Relation Age of Onset  . Stroke Maternal Grandmother   . Stroke Paternal Grandmother    Social History   Tobacco Use  . Smoking status: Never Smoker  . Smokeless tobacco: Never Used  Substance Use Topics  . Alcohol use: No    Alcohol/week: 0.6 oz    Types: 1 Cans of beer per week    Comment: occasionallly stop August 2017  . Drug use: No      Review of Systems Per HPI    Objective:   Physical Exam Physical Exam  Constitutional: Oriented to person, place, and time. He appears well-developed and well-nourished.  HENT:  Head: Normocephalic and atraumatic.  Eyes: Conjunctivae are normal.  Neck: Normal range of motion. Neck supple.  Cardiovascular: Normal rate, regular rhythm and normal heart sounds.   Pulmonary/Chest: Effort normal and breath sounds normal.  Musculoskeletal: No edema.  Neurological: Alert and oriented to person, place, and time.  Skin: Skin is warm and dry.  Psychiatric: Normal mood and affect. Behavior is normal. Judgment and thought content normal.  Vitals  reviewed.     BP 118/60   Pulse 66   Temp 97.9 F (36.6 C) (Oral)   Wt 198 lb 8 oz (90 kg)   SpO2 99%   BMI 28.08 kg/m  Wt Readings from Last 3 Encounters:  11/08/17 198 lb 8 oz (90 kg)  03/01/17 205 lb 8 oz (93.2 kg)  02/18/17 202 lb 9.6 oz (91.9 kg)       Assessment & Plan:  1. Cerebrovascular accident (CVA) due to embolism of right middle cerebral artery ([place]) - Ambulatory referral to Neurology  2. Overweight with body mass index (BMI) 25.0-29.9 - Hemoglobin A1c - discussed his diet and weight loss, encouraged continued healthy food choices and physical activity.   3. Elevated LDL cholesterol level - Lipid panel  - follow up for CPE as scheduled 11/19  Clarene Reamer, FNP-BC  [facility]  11/08/2017 9:58 AM

## 2018-03-20 ENCOUNTER — Other Ambulatory Visit: Payer: Self-pay | Admitting: Family Medicine

## 2018-03-20 DIAGNOSIS — E7800 Pure hypercholesterolemia, unspecified: Secondary | ICD-10-CM

## 2018-03-21 ENCOUNTER — Telehealth: Payer: Self-pay | Admitting: *Deleted

## 2018-03-21 DIAGNOSIS — R195 Other fecal abnormalities: Secondary | ICD-10-CM | POA: Diagnosis not present

## 2018-03-21 DIAGNOSIS — K921 Melena: Secondary | ICD-10-CM | POA: Diagnosis not present

## 2018-03-21 DIAGNOSIS — Z7982 Long term (current) use of aspirin: Secondary | ICD-10-CM | POA: Diagnosis not present

## 2018-03-21 DIAGNOSIS — K625 Hemorrhage of anus and rectum: Secondary | ICD-10-CM | POA: Diagnosis not present

## 2018-03-21 NOTE — Telephone Encounter (Signed)
Spoke to pt who states he has been experiencing blood in stool on and off since Tuesday. He reports that it is bright red blood, and is visible in his stool as well as on the tissue when he wipes. Pt has also experienced some leg pain and cramping, but denies any difficulty in walking , fever or rash. 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

## 2018-03-21 NOTE — Telephone Encounter (Signed)
Pt contacted office back stating that he is needing a referral for colonoscopy. He prefers to be seen at [facility] where he is employed.

## 2018-03-21 NOTE — Telephone Encounter (Signed)
noted 

## 2018-03-23 NOTE — Telephone Encounter (Signed)
Please call patient and find out if he has a specific gastroenterologist and location in the [facility] system that he wants to be referred to.

## 2018-03-24 ENCOUNTER — Other Ambulatory Visit: Payer: Self-pay | Admitting: Family Medicine

## 2018-03-24 DIAGNOSIS — K625 Hemorrhage of anus and rectum: Secondary | ICD-10-CM

## 2018-03-24 NOTE — Telephone Encounter (Signed)
Called and spoke to patient he does not have a specific doctor or location.

## 2018-03-24 NOTE — Telephone Encounter (Signed)
Referral placed, he should get a call about scheduling.

## 2018-03-25 NOTE — Telephone Encounter (Signed)
Patient aware nothing further needed at this time.  

## 2018-04-04 ENCOUNTER — Encounter: Payer: BLUE CROSS/BLUE SHIELD | Admitting: Family Medicine

## 2018-04-28 ENCOUNTER — Encounter: Payer: BLUE CROSS/BLUE SHIELD | Admitting: Family Medicine

## 2018-04-30 ENCOUNTER — Encounter: Payer: BLUE CROSS/BLUE SHIELD | Admitting: Family Medicine

## 2018-05-05 ENCOUNTER — Other Ambulatory Visit (INDEPENDENT_AMBULATORY_CARE_PROVIDER_SITE_OTHER): Payer: BLUE CROSS/BLUE SHIELD

## 2018-05-05 ENCOUNTER — Other Ambulatory Visit: Payer: Self-pay | Admitting: Family Medicine

## 2018-05-05 DIAGNOSIS — R7303 Prediabetes: Secondary | ICD-10-CM | POA: Diagnosis not present

## 2018-05-05 DIAGNOSIS — E663 Overweight: Secondary | ICD-10-CM | POA: Diagnosis not present

## 2018-05-05 LAB — COMPREHENSIVE METABOLIC PANEL
ALT: 17 U/L (ref 0–53)
AST: 18 U/L (ref 0–37)
Albumin: 4.5 g/dL (ref 3.5–5.2)
Alkaline Phosphatase: 76 U/L (ref 39–117)
BUN: 11 mg/dL (ref 6–23)
CALCIUM: 9.8 mg/dL (ref 8.4–10.5)
CHLORIDE: 106 meq/L (ref 96–112)
CO2: 28 meq/L (ref 19–32)
CREATININE: 1.18 mg/dL (ref 0.40–1.50)
GFR: 84.16 mL/min (ref >60.00)
Glucose, Bld: 102 mg/dL — ABNORMAL HIGH (ref 70–99)
Potassium: 4.1 mEq/L (ref 3.5–5.1)
Sodium: 141 mEq/L (ref 135–145)
Total Bilirubin: 1.1 mg/dL (ref 0.2–1.2)
Total Protein: 7 g/dL (ref 6.0–8.3)

## 2018-05-05 LAB — TSH: TSH: 0.73 u[IU]/mL (ref 0.35–4.50)

## 2018-05-05 LAB — HEMOGLOBIN A1C: HEMOGLOBIN A1C: 6 % (ref 4.6–6.5)

## 2018-05-05 NOTE — Progress Notes (Signed)
Lab orders for CPE

## 2018-05-09 ENCOUNTER — Ambulatory Visit (INDEPENDENT_AMBULATORY_CARE_PROVIDER_SITE_OTHER): Payer: BLUE CROSS/BLUE SHIELD | Admitting: Family Medicine

## 2018-05-09 ENCOUNTER — Encounter: Payer: Self-pay | Admitting: Family Medicine

## 2018-05-09 VITALS — BP 110/78 | HR 72 | Temp 97.7°F | Ht 70.5 in | Wt 200.8 lb

## 2018-05-09 DIAGNOSIS — Z23 Encounter for immunization: Secondary | ICD-10-CM

## 2018-05-09 DIAGNOSIS — I63411 Cerebral infarction due to embolism of right middle cerebral artery: Secondary | ICD-10-CM

## 2018-05-09 DIAGNOSIS — K625 Hemorrhage of anus and rectum: Secondary | ICD-10-CM | POA: Diagnosis not present

## 2018-05-09 DIAGNOSIS — Z0000 Encounter for general adult medical examination without abnormal findings: Secondary | ICD-10-CM | POA: Diagnosis not present

## 2018-05-09 NOTE — Patient Instructions (Signed)
Good to see you today  Please follow up in 6 months  You will get a call about GI appointment for rectal bleeding  I will let you know what I find out about aspirin therapy from neurology   Health Maintenance, Male A healthy lifestyle and preventive care is important for your health and wellness. Ask your health care provider about what schedule of regular examinations is right for you. What should I know about weight and diet? Eat a Healthy Diet  Eat plenty of vegetables, fruits, whole grains, low-fat dairy products, and lean protein.  Do not eat a lot of foods high in solid fats, added sugars, or salt.  Maintain a Healthy Weight Regular exercise can help you achieve or maintain a healthy weight. You should:  Do at least 150 minutes of exercise each week. The exercise should increase your heart rate and make you sweat (moderate-intensity exercise).  Do strength-training exercises at least twice a week. Watch Your Levels of Cholesterol and Blood Lipids  Have your blood tested for lipids and cholesterol every 5 years starting at 49 years of age. If you are at high risk for heart disease, you should start having your blood tested when you are 49 years old. You may need to have your cholesterol levels checked more often if: ? Your lipid or cholesterol levels are high. ? You are older than 49 years of age. ? You are at high risk for heart disease. What should I know about cancer screening? Many types of cancers can be detected early and may often be prevented. Lung Cancer  You should be screened every year for lung cancer if: ? You are a current smoker who has smoked for at least 30 years. ? You are a former smoker who has quit within the past 15 years.  Talk to your health care provider about your screening options, when you should start screening, and how often you should be screened. Colorectal Cancer  Routine colorectal cancer screening usually begins at 49 years of age and  should be repeated every 5-10 years until you are 49 years old. You may need to be screened more often if early forms of precancerous polyps or small growths are found. Your health care provider may recommend screening at an earlier age if you have risk factors for colon cancer.  Your health care provider may recommend using home test kits to check for hidden blood in the stool.  A small camera at the end of a tube can be used to examine your colon (sigmoidoscopy or colonoscopy). This checks for the earliest forms of colorectal cancer. Prostate and Testicular Cancer  Depending on your age and overall health, your health care provider may do certain tests to screen for prostate and testicular cancer.  Talk to your health care provider about any symptoms or concerns you have about testicular or prostate cancer. Skin Cancer  Check your skin from head to toe regularly.  Tell your health care provider about any new moles or changes in moles, especially if: ? There is a change in a mole's size, shape, or color. ? You have a mole that is larger than a pencil eraser.  Always use sunscreen. Apply sunscreen liberally and repeat throughout the day.  Protect yourself by wearing long sleeves, pants, a wide-brimmed hat, and sunglasses when outside. What should I know about heart disease, diabetes, and high blood pressure?  If you are 30-73 years of age, have your blood pressure checked every 3-5 years.  If you are 45 years of age or older, have your blood pressure checked every year. You should have your blood pressure measured twice-once when you are at a hospital or clinic, and once when you are not at a hospital or clinic. Record the average of the two measurements. To check your blood pressure when you are not at a hospital or clinic, you can use: ? An automated blood pressure machine at a pharmacy. ? A home blood pressure monitor.  Talk to your health care provider about your target blood  pressure.  If you are between 27-56 years old, ask your health care provider if you should take aspirin to prevent heart disease.  Have regular diabetes screenings by checking your fasting blood sugar level. ? If you are at a normal weight and have a low risk for diabetes, have this test once every three years after the age of 34. ? If you are overweight and have a high risk for diabetes, consider being tested at a younger age or more often.  A one-time screening for abdominal aortic aneurysm (AAA) by ultrasound is recommended for men aged 62-75 years who are current or former smokers. What should I know about preventing infection? Hepatitis B If you have a higher risk for hepatitis B, you should be screened for this virus. Talk with your health care provider to find out if you are at risk for hepatitis B infection. Hepatitis C Blood testing is recommended for:  Everyone born from [birth year] through 1967.  Anyone with known risk factors for hepatitis C. Sexually Transmitted Diseases (STDs)  You should be screened each year for STDs including gonorrhea and chlamydia if: ? You are sexually active and are younger than 49 years of age. ? You are older than 49 years of age and your health care provider tells you that you are at risk for this type of infection. ? Your sexual activity has changed since you were last screened and you are at an increased risk for chlamydia or gonorrhea. Ask your health care provider if you are at risk.  Talk with your health care provider about whether you are at high risk of being infected with HIV. Your health care provider may recommend a prescription medicine to help prevent HIV infection. What else can I do?  Schedule regular health, dental, and eye exams.  Stay current with your vaccines (immunizations).  Do not use any tobacco products, such as cigarettes, chewing tobacco, and e-cigarettes. If you need help quitting, ask your health care provider.  Limit  alcohol intake to no more than 2 drinks per day. One drink equals 12 ounces of beer, 5 ounces of wine, or 1 ounces of hard liquor.  Do not use street drugs.  Do not share needles.  Ask your health care provider for help if you need support or information about quitting drugs.  Tell your health care provider if you often feel depressed.  Tell your health care provider if you have ever been abused or do not feel safe at home. This information is not intended to replace advice given to you by your health care provider. Make sure you discuss any questions you have with your health care provider. Document Released: 11/03/2007 Document Revised: 01/04/2016 Document Reviewed: 02/08/2015 Elsevier Interactive Patient Education  2019 [employer].

## 2018-05-09 NOTE — Progress Notes (Signed)
Subjective:    Patient ID: Aaron [name], male    DOB: [birth year], 47 y.o.   MRN: [id number]  HPI This is a 47 yo male who presents today for CPE. Daughter at [employer] trying to get into nursing school. Other daughter sophomore in [place]. Wife has been really motivated with diet and has lost 60 pounds.   Last CPE- 2 years Tdap- unsure, today Flu- annual Dental- regular Eye- unsure Exercise- not regularly like before due to job/commute  Started a new job at [employer]. Loves it.   Was seen in ER last month with rectal bleeding. Lasted for 3 days. He was instructed to stop his ASA 325, which he did, and he has not had anymore bleeding. Was referred for colonoscopy and had colonoscopy scheduled at [facility], but had to cancel because he didn't have a driver. He would like to be seen in [place] as that would be a more convenient location.   He has a history of stroke in 2018 and was seen initially by neurology but was released from follow up. He was instructed to be maintained on daily ASA. He has been off his ASA since last month and is not sure if he should go back on it.   Past Medical History:  Diagnosis Date  . Medical history non-contributory   . Stroke [facility])    Past Surgical History:  Procedure Laterality Date  . NO PAST SURGERIES    . TEE WITHOUT CARDIOVERSION N/A 08/16/2015   Procedure: TRANSESOPHAGEAL ECHOCARDIOGRAM (TEE);  Surgeon: Larey Dresser, MD;  Location: [facility] ENDOSCOPY;  Service: Cardiovascular;  Laterality: N/A;   Family History  Problem Relation Age of Onset  . Stroke Maternal Grandmother   . Stroke Paternal Grandmother    Social History   Tobacco Use  . Smoking status: Never Smoker  . Smokeless tobacco: Never Used  Substance Use Topics  . Alcohol use: No    Alcohol/week: 1.0 standard drinks    Types: 1 Cans of beer per week    Comment: occasionallly stop August 2017  . Drug use: No     Review of Systems  Constitutional: Negative.   HENT: Negative.   Eyes: Negative.    Respiratory: Negative.   Cardiovascular: Negative.   Gastrointestinal: Positive for anal bleeding (last month x 3 days, none since stopping ASA). Negative for abdominal pain, constipation, diarrhea and nausea.  Endocrine: Negative.   Genitourinary: Negative.   Musculoskeletal: Negative.   Skin: Negative.   Allergic/Immunologic: Negative.   Neurological: Negative.   Hematological: Negative.   Psychiatric/Behavioral: Negative.        Objective:   Physical Exam Physical Exam  Constitutional: He is oriented to person, place, and time. He appears well-developed and well-nourished.  HENT:  Head: Normocephalic and atraumatic.  Right Ear: External ear normal.  Left Ear: External ear normal.  Nose: Nose normal.  Mouth/Throat: Oropharynx is clear and moist.  Eyes: Conjunctivae are normal. Pupils are equal, round, and reactive to light.  Neck: Normal range of motion. Neck supple.  Cardiovascular: Normal rate, regular rhythm, normal heart sounds and intact distal pulses.   Pulmonary/Chest: Effort normal and breath sounds normal.  Abdominal: Soft. Bowel sounds are normal. Hernia confirmed negative in the right inguinal area and confirmed negative in the left inguinal area.  Genitourinary: Testes normal and penis normal. Circumcised.  Musculoskeletal: Normal range of motion. He exhibits no edema or tenderness.       Cervical back: Normal.       Thoracic  back: Normal.       Lumbar back: Normal.  Lymphadenopathy:    He has no cervical adenopathy.       Right: No inguinal adenopathy present.       Left: No inguinal adenopathy present.  Neurological: He is alert and oriented to person, place, and time. He has normal reflexes.  Skin: Skin is warm and dry.  Psychiatric: He has a normal mood and affect. His behavior is normal. Judgment normal.  Vitals reviewed.     BP 110/78 (BP Location: Right Arm, Patient Position: Sitting)   Pulse 72   Temp 97.7 F (36.5 C) (Oral)   Ht 5' 10.5"  (1.791 m)   Wt 200 lb 12.8 oz (91.1 kg)   SpO2 98%   BMI 28.40 kg/m  Wt Readings from Last 3 Encounters:  05/09/18 200 lb 12.8 oz (91.1 kg)  11/08/17 198 lb 8 oz (90 kg)  03/01/17 205 lb 8 oz (93.2 kg)   Depression screen [facility] 2/9 05/09/2018 03/01/2017  Decreased Interest 0 0  Down, Depressed, Hopeless 0 0  PHQ - 2 Score 0 0       Assessment & Plan:  1. Annual physical exam -Discussed and encouraged healthy lifestyle choices- adequate sleep, regular exercise, stress management and healthy food choices.   2. Rectal bleeding - Ambulatory referral to Gastroenterology  3. Need for Tdap vaccination - Tdap  4. Cerebrovascular accident (CVA) due to embolism of right middle cerebral artery ([place]) - Hgba1c, lipids and BP at goal - will contact neurology regarding ASA   - follow up in 6 months   Clarene Reamer, FNP-BC  [facility], [place]  05/09/2018 6:02 PM

## 2018-05-10 ENCOUNTER — Telehealth: Payer: Self-pay | Admitting: Family Medicine

## 2018-05-10 NOTE — Telephone Encounter (Signed)
Please call patient and tell him that I heard back from Dr. Erlinda Hong who said it is fine for him to take an 81 mg dose of aspirin for stroke prevention. If he has any additional rectal bleeding, please notify the office.

## 2018-05-12 NOTE — Telephone Encounter (Signed)
Called and spoke with patient understanding verbalized nothing further needed at this time.

## 2018-05-15 ENCOUNTER — Encounter: Payer: Self-pay | Admitting: Internal Medicine

## 2018-06-11 ENCOUNTER — Encounter: Payer: Self-pay | Admitting: Internal Medicine

## 2018-06-11 ENCOUNTER — Ambulatory Visit: Payer: BLUE CROSS/BLUE SHIELD | Admitting: Internal Medicine

## 2018-06-11 VITALS — BP 120/82 | HR 64 | Ht 70.5 in | Wt 206.0 lb

## 2018-06-11 DIAGNOSIS — R195 Other fecal abnormalities: Secondary | ICD-10-CM | POA: Diagnosis not present

## 2018-06-11 DIAGNOSIS — K625 Hemorrhage of anus and rectum: Secondary | ICD-10-CM

## 2018-06-11 DIAGNOSIS — Z1211 Encounter for screening for malignant neoplasm of colon: Secondary | ICD-10-CM

## 2018-06-11 MED ORDER — PEG-KCL-NACL-NASULF-NA ASC-C 140 G PO SOLR: 1.0000 | Freq: Once | ORAL | 0 refills | Status: AC

## 2018-06-11 NOTE — Progress Notes (Signed)
HISTORY OF PRESENT ILLNESS:  Aaron [name] is a pleasant 48 y.o. 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  Patient reports developing rectal bleeding approximately 2 months ago.  He describes significant red blood on the tissue and admixed with the stool on 3 consecutive days.  There was no associated abdominal or rectal pain.  The bleeding has since subsided.  He was evaluated at the [facility] emergency room March 21, 2018.  He was found to have Hemoccult positive stool without other abnormalities.  Blood work from December 2019 revealed unremarkable comprehensive metabolic panel.  CBC obtained at [facility].  Result requested.  He tells me that he had "a light stroke "remotely and had been taking aspirin 325 mg daily.  Currently taking baby aspirin after a hiatus when he developed the bleeding.  REVIEW OF SYSTEMS:  All non-GI ROS negative entirely upon complete review or would be otherwise stated in the HPI  Past Medical History:  Diagnosis Date  . Medical history non-contributory   . Stroke [facility])     Past Surgical History:  Procedure Laterality Date  . NO PAST SURGERIES    . TEE WITHOUT CARDIOVERSION N/A 08/16/2015   Procedure: TRANSESOPHAGEAL ECHOCARDIOGRAM (TEE);  Surgeon: Larey Dresser, MD;  Location: [facility];  Service: Cardiovascular;  Laterality: N/A;    Social History Aaron [name]  reports that he has never smoked. He has never used smokeless tobacco. He reports that he does not drink alcohol or use drugs.  family history includes Diabetes in his paternal grandmother; Stroke in his maternal grandmother and paternal grandmother.  No Known Allergies     PHYSICAL EXAMINATION: Vital signs: BP 120/82   Pulse 64   Ht 5' 10.5" (1.791 m)   Wt 206 lb (93.4 kg)   BMI 29.14 kg/m   Constitutional: generally well-appearing, no acute  distress Psychiatric: alert and oriented x3, cooperative Eyes: extraocular movements intact, anicteric, conjunctiva pink Mouth: oral pharynx moist, no lesions Neck: supple no lymphadenopathy Cardiovascular: heart regular rate and rhythm, no murmur Lungs: clear to auscultation bilaterally Abdomen: soft, nontender, nondistended, no obvious ascites, no peritoneal signs, normal bowel sounds, no organomegaly Rectal: Deferred until colonoscopy Extremities: no clubbing, cyanosis, or lower extremity edema bilaterally Skin: no lesions on visible extremities Neuro: No focal deficits.  Cranial nerves intact  ASSESSMENT:  1.  3-day history of rectal bleeding for which he was evaluated early November 2019.  No recurrence.  Rule out benign anorectal pathology.  Rule out neoplasia 2.  Hemoccult positive stool 3.  Colon cancer screening.  Baseline risk  PLAN:  1.  Colonoscopy.The nature of the procedure, as well as the risks, benefits, and alternatives were carefully and thoroughly reviewed with the patient. Ample time for discussion and questions allowed. The patient understood, was satisfied, and agreed to proceed.  A copy of this consultation note has been sent to nurse Carlean Purl

## 2018-06-11 NOTE — Patient Instructions (Signed)

## 2018-06-20 ENCOUNTER — Encounter: Payer: Self-pay | Admitting: Internal Medicine

## 2018-06-20 ENCOUNTER — Ambulatory Visit: Payer: BLUE CROSS/BLUE SHIELD | Admitting: Internal Medicine

## 2018-06-20 VITALS — BP 116/78 | HR 106 | Temp 101.2°F | Ht 70.5 in | Wt 207.0 lb

## 2018-06-20 DIAGNOSIS — J101 Influenza due to other identified influenza virus with other respiratory manifestations: Secondary | ICD-10-CM | POA: Diagnosis not present

## 2018-06-20 DIAGNOSIS — R6889 Other general symptoms and signs: Secondary | ICD-10-CM

## 2018-06-20 LAB — POCT INFLUENZA A/B
INFLUENZA A, POC: POSITIVE — AB
Influenza B, POC: NEGATIVE

## 2018-06-20 MED ORDER — OSELTAMIVIR PHOSPHATE 75 MG PO CAPS: 75.0000 mg | ORAL_CAPSULE | Freq: Two times a day (BID) | ORAL | 0 refills | Status: AC

## 2018-06-20 NOTE — Progress Notes (Signed)
   Subjective:    Patient ID: Aaron [name], male    DOB: [birth year], 48 y.o.   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  Pt denies chest pain, increased sob or doe, wheezing, orthopnea, PND, increased LE swelling, palpitations, dizziness or syncope.  Pt denies new neurological symptoms such as new headache, or facial or extremity weakness or numbness   Pt denies polydipsia, polyuria  No other new complaints.  No abd pain or diarrhea Past Medical History:  Diagnosis Date  . Medical history non-contributory   . Stroke [facility])    Past Surgical History:  Procedure Laterality Date  . NO PAST SURGERIES    . TEE WITHOUT CARDIOVERSION N/A 08/16/2015   Procedure: TRANSESOPHAGEAL ECHOCARDIOGRAM (TEE);  Surgeon: Larey Dresser, MD;  Location: [facility] ENDOSCOPY;  Service: Cardiovascular;  Laterality: N/A;    reports that he has never smoked. He has never used smokeless tobacco. He reports that he does not drink alcohol or use drugs. family history includes Diabetes in his paternal grandmother; Stroke in his maternal grandmother and paternal grandmother. No Known Allergies Current Outpatient Medications on File Prior to Visit  Medication Sig Dispense Refill  . atorvastatin (LIPITOR) 10 MG tablet TAKE 1 TABLET BY MOUTH EVERY DAY 30 tablet 11   No current facility-administered medications on file prior to visit.    Review of Systems  Constitutional: Negative for other unusual diaphoresis or sweats HENT: Negative for ear discharge or swelling Eyes: Negative for other worsening visual disturbances Respiratory: Negative for stridor or other swelling  Gastrointestinal: Negative for worsening distension or other blood Genitourinary: Negative for retention or other urinary change Musculoskeletal: Negative for  other MSK pain or swelling Skin: Negative for color change or other new lesions Neurological: Negative for worsening tremors and other numbness  Psychiatric/Behavioral: Negative for worsening agitation or other fatigue All other system neg per pt    Objective:   Physical Exam BP 116/78   Pulse (!) 106   Temp (!) 101.2 F (38.4 C) (Oral)   Ht 5' 10.5" (1.791 m)   Wt 207 lb (93.9 kg)   SpO2 98%   BMI 29.28 kg/m  VS noted, mild ill Constitutional: Pt appears in NAD HENT: Head: NCAT.  Right Ear: External ear normal.  Left Ear: External ear normal.  Bilat tm's with mild erythema.  Max sinus areas mild tender.  Pharynx with mild erythema, no exudate Eyes: . Pupils are equal, round, and reactive to light. Conjunctivae and EOM are normal Nose: without d/c or deformity Neck: Neck supple. Gross normal ROM Cardiovascular: Normal rate and regular rhythm.   Pulmonary/Chest: Effort normal and breath sounds without rales or wheezing.  Neurological: Pt is alert. At baseline orientation, motor grossly intact Skin: Skin is warm. No rashes, other new lesions, no LE edema Psychiatric: Pt behavior is normal without agitation  No other exam findings  POCT Influenza A/B   Ref Range & Units 15:39  Influenza A, POC Negative PositiveAbnormal    Influenza B, POC Negative Negative            Assessment & Plan:

## 2018-06-20 NOTE — Patient Instructions (Addendum)
Please take all new medication as prescribed - the Tamiflu   You can also take Delsym OTC for cough, and/or Mucinex (or it's generic off brand) for congestion, and tylenol as needed for pain.  Please continue all other medications as before, and refills have been done if requested.  Please have the pharmacy call with any other refills you may need.  Please keep your appointments with your specialists as you may have planned

## 2018-06-20 NOTE — Assessment & Plan Note (Addendum)
Mild to mod, for tamiflu course,  to f/u any worsening symptoms or concerns  

## 2018-07-15 ENCOUNTER — Encounter: Payer: Self-pay | Admitting: Internal Medicine

## 2018-07-15 ENCOUNTER — Ambulatory Visit (AMBULATORY_SURGERY_CENTER): Payer: BLUE CROSS/BLUE SHIELD | Admitting: Internal Medicine

## 2018-07-15 VITALS — BP 104/74 | HR 72 | Temp 98.0°F | Resp 18 | Ht 70.0 in | Wt 206.0 lb

## 2018-07-15 DIAGNOSIS — Z1211 Encounter for screening for malignant neoplasm of colon: Secondary | ICD-10-CM | POA: Diagnosis not present

## 2018-07-15 DIAGNOSIS — D123 Benign neoplasm of transverse colon: Secondary | ICD-10-CM | POA: Diagnosis not present

## 2018-07-15 MED ORDER — SODIUM CHLORIDE 0.9 % IV SOLN: 500.0000 mL | Freq: Once | INTRAVENOUS | Status: DC

## 2018-07-15 NOTE — Patient Instructions (Signed)
  Thank you for allowing [place] to care for you today.  Resume previous diet and medications today.  Return to normal activities tomorrow.  Await pathology results, approximately 2 weeks.  Recommendation for next screening colonoscopy  will be made at that time.  Handout given for polyps and diverticulosis.   YOU HAD AN ENDOSCOPIC PROCEDURE TODAY AT [facility]:   Refer to the procedure report that was given to you for any specific questions about what was found during the examination.  If the procedure report does not answer your questions, please call your gastroenterologist to clarify.  If you requested that your care partner not be given the details of your procedure findings, then the procedure report has been included in a sealed envelope for you to review at your convenience later.  YOU SHOULD EXPECT: Some feelings of bloating in the abdomen. Passage of more gas than usual.  Walking can help get rid of the air that was put into your GI tract during the procedure and reduce the bloating. If you had a lower endoscopy (such as a colonoscopy or flexible sigmoidoscopy) you may notice spotting of blood in your stool or on the toilet paper. If you underwent a bowel prep for your procedure, you may not have a normal bowel movement for a few days.  Please Note:  You might notice some irritation and congestion in your nose or some drainage.  This is from the oxygen used during your procedure.  There is no need for concern and it should clear up in a day or so.  SYMPTOMS TO REPORT IMMEDIATELY:   Following lower endoscopy (colonoscopy or flexible sigmoidoscopy):  Excessive amounts of blood in the stool  Significant tenderness or worsening of abdominal pains  Swelling of the abdomen that is new, acute  Fever of 100F or higher    For urgent or emergent issues, a gastroenterologist can be reached at any hour by calling [phone].   DIET:  We do recommend a small meal at first,  but then you may proceed to your regular diet.  Drink plenty of fluids but you should avoid alcoholic beverages for 24 hours.  ACTIVITY:  You should plan to take it easy for the rest of today and you should NOT DRIVE or use heavy machinery until tomorrow (because of the sedation medicines used during the test).    FOLLOW UP: Our staff will call the number listed on your records the next business day following your procedure to check on you and address any questions or concerns that you may have regarding the information given to you following your procedure. If we do not reach you, we will leave a message.  However, if you are feeling well and you are not experiencing any problems, there is no need to return our call.  We will assume that you have returned to your regular daily activities without incident.  If any biopsies were taken you will be contacted by phone or by letter within the next 1-3 weeks.  Please call us at [phone] if you have not heard about the biopsies in 3 weeks.    SIGNATURES/CONFIDENTIALITY: You and/or your care partner have signed paperwork which will be entered into your electronic medical record.  These signatures attest to the fact that that the information above on your After Visit Summary has been reviewed and is understood.  Full responsibility of the confidentiality of this discharge information lies with you and/or your care-partner.

## 2018-07-15 NOTE — Progress Notes (Signed)
Called to room to assist during endoscopic procedure.  Patient ID and intended procedure confirmed with present staff. Received instructions for my participation in the procedure from the performing physician.  

## 2018-07-15 NOTE — Op Note (Signed)
[facility] Patient Name: Elena Cothern Procedure Date: 07/15/2018 1:21 PM MRN: [id number] Endoscopist: Docia Chuck. Henrene Pastor , MD Age: 48 Referring MD:  Date of Birth: [birth year] Gender: Male Account #: [id number] Procedure:                Colonoscopy with cold snare polypectomy x 1 Indications:              Screening for colorectal malignant neoplasm Medicines:                Monitored Anesthesia Care Procedure:                Pre-Anesthesia Assessment:                           - Prior to the procedure, a History and Physical                            was performed, and patient medications and                            allergies were reviewed. The patient's tolerance of                            previous anesthesia was also reviewed. The risks                            and benefits of the procedure and the sedation                            options and risks were discussed with the patient.                            All questions were answered, and informed consent                            was obtained. Prior Anticoagulants: The patient has                            taken no previous anticoagulant or antiplatelet                            agents. ASA Grade Assessment: II - A patient with                            mild systemic disease. After reviewing the risks                            and benefits, the patient was deemed in                            satisfactory condition to undergo the procedure.                           After obtaining informed consent, the colonoscope  was passed under direct vision. Throughout the                            procedure, the patient's blood pressure, pulse, and                            oxygen saturations were monitored continuously. The                            Colonoscope was introduced through the anus and                            advanced to the the cecum, identified by   appendiceal orifice and ileocecal valve. The                            ileocecal valve, appendiceal orifice, and rectum                            were photographed. The quality of the bowel                            preparation was excellent. The colonoscopy was                            performed without difficulty. The patient tolerated                            the procedure well. The bowel preparation used was                            SUPREP. Scope In: 1:24:36 PM Scope Out: 1:38:49 PM Scope Withdrawal Time: 0 hours 11 minutes 48 seconds  Total Procedure Duration: 0 hours 14 minutes 13 seconds  Findings:                 A 2 mm polyp was found in the proximal transverse                            colon. The polyp was removed with a cold snare.                            Resection and retrieval were complete.                           A few small-mouthed diverticula were found in the                            sigmoid colon.                           Internal hemorrhoids were found during                            retroflexion. The hemorrhoids were small.  The exam was otherwise without abnormality on                            direct and retroflexion views. Complications:            No immediate complications. Estimated blood loss:                            None. Estimated Blood Loss:     Estimated blood loss: none. Impression:               - One 2 mm polyp in the proximal transverse colon,                            removed with a cold snare. Resected and retrieved.                           - Diverticulosis in the sigmoid colon.                           - Internal hemorrhoids.                           - The examination was otherwise normal on direct                            and retroflexion views. Recommendation:           - Repeat colonoscopy in 7-10 years for surveillance.                           - Patient has a contact number available for                             emergencies. The signs and symptoms of potential                            delayed complications were discussed with the                            patient. Return to normal activities tomorrow.                            Written discharge instructions were provided to the                            patient.                           - Resume previous diet.                           - Continue present medications.                           - Await pathology results. Docia Chuck. Henrene Pastor, MD 07/15/2018 1:45:04 PM This report has been signed electronically.

## 2018-07-15 NOTE — Progress Notes (Signed)
Pt awake. VSS. Report given to RN. No anesthetic complications noted 

## 2018-07-16 ENCOUNTER — Telehealth: Payer: Self-pay

## 2018-07-16 NOTE — Telephone Encounter (Signed)
  Follow up Call-  Call back number 07/15/2018  Post procedure Call Back phone  # [phone]  Permission to leave phone message Yes  Some recent data might be hidden     Patient questions:  Do you have a fever, pain , or abdominal swelling? No. Pain Score  0 *  Have you tolerated food without any problems? Yes.    Have you been able to return to your normal activities? Yes.    Do you have any questions about your discharge instructions: Diet   No. Medications  No. Follow up visit  No.  Do you have questions or concerns about your Care? No.  Actions: * If pain score is 4 or above: No action needed, pain <4.

## 2018-07-16 NOTE — Telephone Encounter (Signed)
No answer, left message to call back later today, B.Deavon Podgorski RN. 

## 2018-07-21 ENCOUNTER — Encounter: Payer: Self-pay | Admitting: Internal Medicine

## 2018-11-10 ENCOUNTER — Ambulatory Visit: Payer: BLUE CROSS/BLUE SHIELD | Admitting: Family Medicine

## 2020-04-21 ENCOUNTER — Telehealth: Payer: Self-pay

## 2020-04-21 NOTE — Telephone Encounter (Signed)
Noted  

## 2020-04-21 NOTE — Telephone Encounter (Signed)
[facility] - Client TELEPHONE ADVICE RECORD [facility] Patient Name: Aaron [name] Gender: Male DOB: 1970/12/27 Age: 49 Y 73 M 4 D Return Phone Number: [phone] (Primary), [phone] (Secondary) Address: [place] [address] Client  [facility] - Client Client Site [place] - Day Physician Tor Netters- NP Contact Type Call Who Is Calling Patient / Member / Family / Caregiver Call Type Triage / Clinical Relationship To Patient Self Return Phone Number [phone] (Primary) Chief Complaint Abdominal Pain Reason for Call Symptomatic / Request for Health Information Initial Comment Has abdominal pain for the past few days, thinks its an ulcer. Has appt for tomorrow. Translation No Nurse Assessment Nurse: Harlow Mares, RN, Suanne Marker Date/Time (Eastern Time): 04/21/2020 10:53:25 AM Confirm and document reason for call. If symptomatic, describe symptoms. ---Has abdominal pain for the past few days, thinks its an ulcer. Has appt for tomorrow. Does the patient have any new or worsening symptoms? ---Yes Will a triage be completed? ---Yes Related visit to physician within the last 2 weeks? ---No Does the PT have any chronic conditions? (i.e. diabetes, asthma, this includes High risk factors for pregnancy, etc.) ---No Is this a behavioral health or substance abuse call? ---No Guidelines Guideline Title Affirmed Question Affirmed Notes Nurse Date/Time Eilene Ghazi Time) Abdominal Pain - Upper [1] MODERATE pain (e.g., interferes with normal activities) AND [2] comes and goes (cramps) AND [3] present > 24 hours (Exception: pain with Vomiting or Diarrhea - see that Guideline) Harlow Mares, RN, Rhonda 04/21/2020 10:54:09 AM Disp. Time Eilene Ghazi Time) Disposition Final User 04/21/2020 11:02:35 AM See PCP within 24 Hours Yes Harlow Mares, RN, Suanne Marker PLEASE NOTE: All timestamps contained within this report are  represented as [place] Standard Time. CONFIDENTIALTY NOTICE: This fax transmission is intended only for the addressee. It contains information that is legally privileged, confidential or otherwise protected from use or disclosure. If you are not the intended recipient, you are strictly prohibited from reviewing, disclosing, copying using or disseminating any of this information or taking any action in reliance on or regarding this information. If you have received this fax in error, please notify us immediately by telephone so that we can arrange for its return to [place]. Phone: [phone], Toll-Free: [phone], Fax: [phone] Page: 2 of 2 Call Id: [id number] [place] Disagree/Comply Comply Caller Understands Yes PreDisposition Call Doctor Care Advice Given Per Guideline SEE PCP WITHIN 24 HOURS: * IF OFFICE WILL BE OPEN: You need to be examined within the next 24 hours. Call your doctor (or NP/PA) when the office opens and make an appointment. ANTACID: * If having pain now, try taking an antacid (e.g., Mylanta, Maalox). CALL BACK IF: * Severe pain present over 1 hour * Constant pain present over 2 hours * You become worse CARE ADVICE given per Abdominal Pain, Upper (Adult) guideline. * Dose: 2 tablespoons (30 ml) of liquid. * Try taking an antacid (e.g., Mylanta, Maalox) 1 hour after meals and before bedtime. TAKE ANTACIDS: * Dose: 2 tablespoons (30 ml) of liquid. DRINK CLEAR FLUIDS: * Drink clear fluids only (e.g., water, flat soft drinks or half-strength Gatorade). * Sip small amounts at a time, until you feel better and the pain is gone. * Then slowly return to a regular diet. AVOID ASPIRIN AND NSAIDS: * Avoid taking aspirin and anti-inflammatory medicines (i.e., NSAIDS like ibuprofen/Motrin, naproxen/Aleve) unless you have been told to do so by a doctor (or NP/PA). * These drugs can irritate the stomach lining and make the  pain worse. * Acetaminophen (e.g., Tylenol) does not cause stomach  irritation. Referrals REFERRED TO PCP OFFICE

## 2020-04-21 NOTE — Telephone Encounter (Signed)
Per appt notes pt already has appt scheduled for 04/22/20 at 10:15 with Glenda Chroman FNP.

## 2020-04-22 ENCOUNTER — Encounter: Payer: Self-pay | Admitting: Family Medicine

## 2020-04-22 ENCOUNTER — Other Ambulatory Visit: Payer: Self-pay

## 2020-04-22 ENCOUNTER — Ambulatory Visit: Payer: BC Managed Care – PPO | Admitting: Family Medicine

## 2020-04-22 VITALS — BP 124/90 | HR 83 | Temp 97.7°F | Ht 70.5 in | Wt 212.0 lb

## 2020-04-22 DIAGNOSIS — Q2112 Patent foramen ovale: Secondary | ICD-10-CM | POA: Insufficient documentation

## 2020-04-22 DIAGNOSIS — R1013 Epigastric pain: Secondary | ICD-10-CM | POA: Diagnosis not present

## 2020-04-22 DIAGNOSIS — E7800 Pure hypercholesterolemia, unspecified: Secondary | ICD-10-CM

## 2020-04-22 DIAGNOSIS — I639 Cerebral infarction, unspecified: Secondary | ICD-10-CM | POA: Diagnosis not present

## 2020-04-22 DIAGNOSIS — Q211 Atrial septal defect: Secondary | ICD-10-CM

## 2020-04-22 DIAGNOSIS — K625 Hemorrhage of anus and rectum: Secondary | ICD-10-CM | POA: Diagnosis not present

## 2020-04-22 DIAGNOSIS — Z789 Other specified health status: Secondary | ICD-10-CM | POA: Insufficient documentation

## 2020-04-22 LAB — CBC WITH DIFFERENTIAL/PLATELET
Basophils Absolute: 0.1 10*3/uL (ref 0.0–0.1)
Basophils Relative: 0.8 % (ref 0.0–3.0)
Eosinophils Absolute: 0.1 10*3/uL (ref 0.0–0.7)
Eosinophils Relative: 1.8 % (ref 0.0–5.0)
HCT: 43.5 % (ref 39.0–52.0)
Hemoglobin: 14.8 g/dL (ref 13.0–17.0)
Lymphocytes Relative: 22.7 % (ref 12.0–46.0)
Lymphs Abs: 1.6 10*3/uL (ref 0.7–4.0)
MCHC: 34 g/dL (ref 30.0–36.0)
MCV: 85.2 fl (ref 78.0–100.0)
Monocytes Absolute: 0.6 10*3/uL (ref 0.1–1.0)
Monocytes Relative: 8.7 % (ref 3.0–12.0)
Neutro Abs: 4.6 10*3/uL (ref 1.4–7.7)
Neutrophils Relative %: 66 % (ref 43.0–77.0)
Platelets: 295 10*3/uL (ref 150.0–400.0)
RBC: 5.11 Mil/uL (ref 4.22–5.81)
RDW: 13.5 % (ref 11.5–15.5)
WBC: 7 10*3/uL (ref 4.0–10.5)

## 2020-04-22 LAB — COMPREHENSIVE METABOLIC PANEL
ALT: 18 U/L (ref 0–53)
AST: 20 U/L (ref 0–37)
Albumin: 4.5 g/dL (ref 3.5–5.2)
Alkaline Phosphatase: 83 U/L (ref 39–117)
BUN: 17 mg/dL (ref 6–23)
CO2: 26 mEq/L (ref 19–32)
Calcium: 9.5 mg/dL (ref 8.4–10.5)
Chloride: 105 mEq/L (ref 96–112)
Creatinine, Ser: 1.2 mg/dL (ref 0.40–1.50)
GFR: 69.98 mL/min (ref >60.00)
Glucose, Bld: 104 mg/dL — ABNORMAL HIGH (ref 70–99)
Potassium: 4 mEq/L (ref 3.5–5.1)
Sodium: 139 mEq/L (ref 135–145)
Total Bilirubin: 1.1 mg/dL (ref 0.2–1.2)
Total Protein: 7.4 g/dL (ref 6.0–8.3)

## 2020-04-22 LAB — LIPID PANEL
Cholesterol: 163 mg/dL (ref 0–200)
HDL: 47.8 mg/dL (ref >39.00)
LDL Cholesterol: 99 mg/dL (ref 0–99)
NonHDL: 115.43
Total CHOL/HDL Ratio: 3
Triglycerides: 80 mg/dL (ref 0.0–149.0)
VLDL: 16 mg/dL (ref 0.0–40.0)

## 2020-04-22 MED ORDER — OMEPRAZOLE 20 MG PO CPDR: 20.0000 mg | DELAYED_RELEASE_CAPSULE | Freq: Every day | ORAL | 3 refills | Status: DC

## 2020-04-22 NOTE — Progress Notes (Signed)
Subjective:    Patient ID: Aaron [name], male    DOB: September 05, 1970, 49 y.o.   MRN: [id number]  HPI Chief Complaint  Patient presents with  . Abdominal Pain    excruciating pain near sternum on wednesday night after eating pasta sauce  . Blood In Stools    when wiping x 1 month ago w/ hx of this     This is a 49 yo male who presents for above cc. He  has a past medical history of Leukocytosis, Patent foramen ovale, and Stroke ([place]).  Abdominal pain- had been feeling fine without any problems when symptoms occurred on Monday. Episode started on Monday, continued for several days with worsening pain Wednesday after eating tomato based sauce. Took Kaopectate with good relief. Initial pain was epigastric, sharp, improved with eating. Worse with coffee, black tea. Takes a daily low dose asa for prior stroke. No ETOH. Had one episode of rectal bleeding last month, noticed large amount of blood on toilet paper. Does not recall having constipation, hard stools. Has a history of hemorrhoids, had colonoscopy 07/15/18 that showed internal hemorrhoids, small polyp. Seven year recall. Reports two episodes of hemorrhoids this year, uses preparation H with good results.   History of small embolic stroke, has patent foramen ovale and was not recommended to have closure. He was released by neurology on ASA and instructed to follow up with primary care to mange lipids, blood sugar. He has not been seen since prior to pandemic. He ran out of atorvastatin. Has gained back his weight. Hasn't been exercising since Covid. Eating and drinking more carbs.    Review of Systems Denies headache, visual change, chest pain, shortness of breath, leg swelling    Objective:   Physical Exam Physical Exam  Constitutional: Oriented to person, place, and time. Appears well-developed and well-nourished.  HENT:  Head: Normocephalic and atraumatic.  Eyes: Conjunctivae are normal.  Neck: Normal range of motion. Neck supple.   Cardiovascular: Normal rate, regular rhythm and normal heart sounds.   Pulmonary/Chest: Effort normal and breath sounds normal.  Rectal- no external hemorrhoids, no fissure, brown stool, guaiac negative.  Musculoskeletal: No lower extremity edema.   Neurological: Alert and oriented to person, place, and time.  Skin: Skin is warm and dry.  Psychiatric: Normal mood and affect. Behavior is normal. Judgment and thought content normal.  Vitals reviewed.        BP 124/90   Pulse 83   Temp 97.7 F (36.5 C) (Temporal)   Ht 5' 10.5" (1.791 m)   Wt 212 lb (96.2 kg)   SpO2 97%   BMI 29.99 kg/m  Wt Readings from Last 3 Encounters:  04/22/20 212 lb (96.2 kg)  07/15/18 206 lb (93.4 kg)  06/20/18 207 lb (93.9 kg)    Assessment & Plan:  1. Epigastric pain -Will give him a trial of PPI.  Discussed taking for 1 month, then taking every other day for 1 month and stopping -Follow-up if pain returns, may need ultrasound to rule out gallbladder disease - omeprazole (PRILOSEC) 20 MG capsule; Take 1 capsule (20 mg total) by mouth daily.  Dispense: 30 capsule; Refill: 3  2. Rectal bleeding -Guaiac negative in the office today, no recent bleeding.  He does have a history of internal hemorrhoids on colonoscopy and we discussed importance of regular, soft bowel movements.  Can use Preparation H as needed. - Comprehensive metabolic panel - CBC with Differential  3. Cerebrovascular accident (CVA), unspecified mechanism ([place]) -No recent  symptoms, has been released from follow-up from neurology -He will continue aspirin 81 mg daily -Discussed importance of regular follow-up - Lipid Panel - Comprehensive metabolic panel - CBC with Differential  4. Patent foramen ovale -No closure was recommended during prior work-up  -Needs follow-up for CPE  This visit occurred during the SARS-CoV-2 public health emergency.  Safety protocols were in place, including screening questions prior to the visit,  additional usage of staff PPE, and extensive cleaning of exam room while observing appropriate contact time as indicated for disinfecting solutions.      Clarene Reamer, FNP-BC  [facility]  04/23/2020 7:34 AM

## 2020-04-22 NOTE — Patient Instructions (Signed)
Good to see you today  I have sent you a prescription for omeprazole to cut down on your stomach acid. Take for 4 weeks then can take every other day and then stop.   I will notify you of labs and any further steps

## 2020-04-23 ENCOUNTER — Encounter: Payer: Self-pay | Admitting: Family Medicine

## 2020-04-24 MED ORDER — ATORVASTATIN CALCIUM 20 MG PO TABS: 20.0000 mg | ORAL_TABLET | Freq: Every day | ORAL | 3 refills | Status: DC

## 2020-04-24 NOTE — Addendum Note (Signed)
Addended by: Clarene Reamer B on: 04/24/2020 07:48 PM   Modules accepted: Orders

## 2021-02-22 ENCOUNTER — Encounter: Payer: Self-pay | Admitting: Nurse Practitioner

## 2021-02-22 ENCOUNTER — Other Ambulatory Visit: Payer: Self-pay

## 2021-02-22 ENCOUNTER — Ambulatory Visit
Admission: RE | Admit: 2021-02-22 | Discharge: 2021-02-22 | Disposition: A | Discharge status: Home / Self Care | Payer: BC Managed Care – PPO | Source: Ambulatory Visit | Attending: Nurse Practitioner | Admitting: Nurse Practitioner

## 2021-02-22 ENCOUNTER — Ambulatory Visit: Payer: BC Managed Care – PPO | Admitting: Nurse Practitioner

## 2021-02-22 ENCOUNTER — Ambulatory Visit
Admission: RE | Admit: 2021-02-22 | Discharge: 2021-02-22 | Disposition: A | Discharge status: Home / Self Care | Payer: BC Managed Care – PPO | Attending: Nurse Practitioner | Admitting: Nurse Practitioner

## 2021-02-22 VITALS — BP 132/84 | HR 69 | Temp 98.1°F | Resp 12 | Ht 70.5 in | Wt 206.1 lb

## 2021-02-22 DIAGNOSIS — Z23 Encounter for immunization: Secondary | ICD-10-CM | POA: Diagnosis not present

## 2021-02-22 DIAGNOSIS — Z0000 Encounter for general adult medical examination without abnormal findings: Secondary | ICD-10-CM | POA: Diagnosis not present

## 2021-02-22 DIAGNOSIS — M25561 Pain in right knee: Secondary | ICD-10-CM

## 2021-02-22 DIAGNOSIS — Z125 Encounter for screening for malignant neoplasm of prostate: Secondary | ICD-10-CM | POA: Diagnosis not present

## 2021-02-22 DIAGNOSIS — I63411 Cerebral infarction due to embolism of right middle cerebral artery: Secondary | ICD-10-CM

## 2021-02-22 DIAGNOSIS — M25461 Effusion, right knee: Secondary | ICD-10-CM | POA: Diagnosis not present

## 2021-02-22 LAB — CBC
HCT: 40.4 % (ref 39.0–52.0)
Hemoglobin: 13.4 g/dL (ref 13.0–17.0)
MCHC: 33.1 g/dL (ref 30.0–36.0)
MCV: 85.9 fl (ref 78.0–100.0)
Platelets: 290 10*3/uL (ref 150.0–400.0)
RBC: 4.71 Mil/uL (ref 4.22–5.81)
RDW: 13.7 % (ref 11.5–15.5)
WBC: 6 10*3/uL (ref 4.0–10.5)

## 2021-02-22 LAB — LIPID PANEL
Cholesterol: 103 mg/dL (ref 0–200)
HDL: 48.1 mg/dL (ref >39.00)
LDL Cholesterol: 45 mg/dL (ref 0–99)
NonHDL: 55.35
Total CHOL/HDL Ratio: 2
Triglycerides: 50 mg/dL (ref 0.0–149.0)
VLDL: 10 mg/dL (ref 0.0–40.0)

## 2021-02-22 LAB — COMPREHENSIVE METABOLIC PANEL
ALT: 20 U/L (ref 0–53)
AST: 22 U/L (ref 0–37)
Albumin: 4.6 g/dL (ref 3.5–5.2)
Alkaline Phosphatase: 88 U/L (ref 39–117)
BUN: 17 mg/dL (ref 6–23)
CO2: 28 mEq/L (ref 19–32)
Calcium: 10.1 mg/dL (ref 8.4–10.5)
Chloride: 105 mEq/L (ref 96–112)
Creatinine, Ser: 1.08 mg/dL (ref 0.40–1.50)
GFR: 78.94 mL/min (ref >60.00)
Glucose, Bld: 102 mg/dL — ABNORMAL HIGH (ref 70–99)
Potassium: 4.5 mEq/L (ref 3.5–5.1)
Sodium: 139 mEq/L (ref 135–145)
Total Bilirubin: 0.8 mg/dL (ref 0.2–1.2)
Total Protein: 7.4 g/dL (ref 6.0–8.3)

## 2021-02-22 LAB — PSA: PSA: 0.86 ng/mL (ref 0.10–4.00)

## 2021-02-22 LAB — HEMOGLOBIN A1C: Hgb A1c MFr Bld: 6 % (ref 4.6–6.5)

## 2021-02-22 MED ORDER — DICLOFENAC SODIUM 1 % EX GEL: 2.0000 g | Freq: Four times a day (QID) | CUTANEOUS | 1 refills | Status: DC

## 2021-02-22 NOTE — Assessment & Plan Note (Signed)
Prior to COVID was an active runner.  Since COVID has not been quite as much.  Patient started running again a couple weeks ago and noticed his knee bothered him he stopped and knee pain resolved.  Tried again the pain is there is not going away this time no known injury.  Has tried OTC NSAIDs without great relief.  Will order outpatient x-ray pending lab results and try to diclofenac sodium gel.  If no improvement and no known cause begin to follow-up with sports medicine.

## 2021-02-22 NOTE — Progress Notes (Signed)
Established Patient Office Visit  Subjective:  Patient ID: Aaron [name], male    DOB: 01/26/1971  Age: 50 y.o. MRN: [id number]  CC:  Chief Complaint  Patient presents with   Transfer of Care   Leg Pain    Started about 2 months ago with swelling of the right knee and that improved, now starting to have swelling around the knee and pain radiating down the back of right leg. Slight numbness of the toes of right foot off and on. Improves with movement.    HPI Aaron [name] presents for TOC  HLD: Atorvastatin 20mg . Does not have a regular exercise program. Had started running 2 months ago but his knee started bothering him  Started approx 2 months ago. Self resovled and came back approx 2 weeks ago. Naxproxen for a week and did not help. Advil helped some and it came back. No injury. States worse in the morning  and after periods of rest. With weight bearing. Harp pain that is intermittent and will radiate to calf and hanstring State walking for 10 mins to get the pain to go away.  for complete physical and follow up of chronic conditions.  Immunizations: -Tetanus:2019 -Influenza: today -Covid-19: pfizer x2 and one booster -Shingles: educated and will check with pharmacy about cost -Pneumonia: NA  -HPV: NA  Diet: Fair diet. Salt intake and sugar intake. Coffee and tea drinker Exercise: No regular exercise. Was running but his right knee started to bother him.  Eye exam: Not UTD Dental exam: Completes semi-annually      Colonoscopy: Completed in 2020, due in 2027 PSA: Due, ordered today  Lung Cancer Screening:   Never smoker  Past Medical History:  Diagnosis Date   Leukocytosis    Patent foramen ovale    Stroke [facility])     Past Surgical History:  Procedure Laterality Date   NO PAST SURGERIES     TEE WITHOUT CARDIOVERSION N/A 08/16/2015   Procedure: TRANSESOPHAGEAL ECHOCARDIOGRAM (TEE);  Surgeon: Larey Dresser, MD;  Location: [facility] ENDOSCOPY;  Service: Cardiovascular;   Laterality: N/A;    Family History  Problem Relation Age of Onset   Stroke Maternal Grandmother    Stroke Paternal Grandmother    Diabetes Paternal Grandmother    Colon cancer Neg Hx    Esophageal cancer Neg Hx    Stomach cancer Neg Hx    Rectal cancer Neg Hx     Social History   Socioeconomic History   Marital status: Married    Spouse name: Not on file   Number of children: 2   Years of education: Not on file   Highest education level: Not on file  Occupational History   Not on file  Tobacco Use   Smoking status: Never   Smokeless tobacco: Never  Vaping Use   Vaping Use: Never used  Substance and Sexual Activity   Alcohol use: No    Alcohol/week: 1.0 standard drink    Types: 1 Cans of beer per week    Comment: occasionallly stop August 2017   Drug use: No   Sexual activity: Yes  Other Topics Concern   Not on file  Social History Narrative   Not on file   Social Determinants of Health   Financial Resource Strain: Not on file  Food Insecurity: Not on file  Transportation Needs: Not on file  Physical Activity: Not on file  Stress: Not on file  Social Connections: Not on file  Intimate Partner Violence: Not on file  Outpatient Medications Prior to Visit  Medication Sig Dispense Refill   aspirin 81 MG chewable tablet Chew 81 mg by mouth daily.     atorvastatin (LIPITOR) 20 MG tablet Take 1 tablet (20 mg total) by mouth daily. 90 tablet 3   omeprazole (PRILOSEC) 20 MG capsule Take 1 capsule (20 mg total) by mouth daily. 30 capsule 3   No facility-administered medications prior to visit.    No Known Allergies  ROS Review of Systems  Constitutional:  Negative for chills and fever.  Respiratory:  Negative for cough and shortness of breath.   Cardiovascular:  Negative for chest pain.  Gastrointestinal:  Negative for abdominal pain, diarrhea, nausea and vomiting.  Genitourinary:  Negative for dysuria and hematuria.  Musculoskeletal:  Positive for  arthralgias.  Neurological:  Negative for headaches.     Objective:    Physical Exam Vitals reviewed.  Constitutional:      Appearance: Normal appearance.  HENT:     Right Ear: Tympanic membrane, ear canal and external ear normal. There is no impacted cerumen.     Left Ear: Tympanic membrane, ear canal and external ear normal. There is no impacted cerumen.     Mouth/Throat:     Mouth: Mucous membranes are moist.     Pharynx: Oropharynx is clear.  Eyes:     Extraocular Movements: Extraocular movements intact.     Pupils: Pupils are equal, round, and reactive to light.  Cardiovascular:     Rate and Rhythm: Normal rate and regular rhythm.  Pulmonary:     Effort: Pulmonary effort is normal.     Breath sounds: Normal breath sounds.  Abdominal:     General: Bowel sounds are normal. There is no distension.     Tenderness: There is no abdominal tenderness.  Musculoskeletal:        General: No tenderness.     Right knee: No swelling, deformity or bony tenderness. Normal range of motion. No tenderness. Normal pulse.     Right lower leg: No edema.     Left lower leg: No edema.     Comments: 5/5 bilateral upper and lower extremity strength No laxity on exam. No injury, no joint effusion   Neurological:     Mental Status: He is alert.    BP 132/84   Pulse 69   Temp 98.1 F (36.7 C)   Resp 12   Ht 5' 10.5" (1.791 m)   Wt 206 lb 2 oz (93.5 kg)   SpO2 96%   BMI 29.16 kg/m  Wt Readings from Last 3 Encounters:  02/22/21 206 lb 2 oz (93.5 kg)  04/22/20 212 lb (96.2 kg)  07/15/18 206 lb (93.4 kg)     Health Maintenance Due  Topic Date Due   Hepatitis C Screening  Never done   Zoster Vaccines- Shingrix (1 of 2) Never done   COVID-19 Vaccine (4 - Booster for Pfizer series) 09/27/2020    There are no preventive care reminders to display for this patient.  Lab Results  Component Value Date   TSH 0.73 05/05/2018   Lab Results  Component Value Date   WBC 7.0 04/22/2020    HGB 14.8 04/22/2020   HCT 43.5 04/22/2020   MCV 85.2 04/22/2020   PLT 295.0 04/22/2020   Lab Results  Component Value Date   NA 139 04/22/2020   K 4.0 04/22/2020   CO2 26 04/22/2020   GLUCOSE 104 (H) 04/22/2020   BUN 17 04/22/2020   CREATININE 1.20  04/22/2020   BILITOT 1.1 04/22/2020   ALKPHOS 83 04/22/2020   AST 20 04/22/2020   ALT 18 04/22/2020   PROT 7.4 04/22/2020   ALBUMIN 4.5 04/22/2020   CALCIUM 9.5 04/22/2020   ANIONGAP 7 08/14/2015   GFR 69.98 04/22/2020   Lab Results  Component Value Date   CHOL 163 04/22/2020   Lab Results  Component Value Date   HDL 47.80 04/22/2020   Lab Results  Component Value Date   LDLCALC 99 04/22/2020   Lab Results  Component Value Date   TRIG 80.0 04/22/2020   Lab Results  Component Value Date   CHOLHDL 3 04/22/2020   Lab Results  Component Value Date   HGBA1C 6.0 05/05/2018      Assessment & Plan:   Problem List Items Addressed This Visit       Cardiovascular and Mediastinum   Cerebrovascular accident (CVA) due to embolism of right middle cerebral artery ([place])    States was evaluated by neurology in the past and cleared.  Currently on statin and aspirin.  No residuals.        Other   Acute pain of right knee    Prior to COVID was an active runner.  Since COVID has not been quite as much.  Patient started running again a couple weeks ago and noticed his knee bothered him he stopped and knee pain resolved.  Tried again the pain is there is not going away this time no known injury.  Has tried OTC NSAIDs without great relief.  Will order outpatient x-ray pending lab results and try to diclofenac sodium gel.  If no improvement and no known cause begin to follow-up with sports medicine.      Relevant Medications   diclofenac Sodium (VOLTAREN) 1 % GEL   Other Relevant Orders   DG Knee Complete 4 Views Right   Preventative health care - Primary    Reviewed preventative health care with patient.  Up-to-date on  colonoscopy, vaccines, PSA.  Administer flu vaccine in office today patient does need to have an eye exam.      Relevant Orders   CBC   Comprehensive metabolic panel   Lipid panel   Hemoglobin A1c   Screening for prostate cancer   Relevant Orders   PSA   Need for influenza vaccination   Relevant Orders   Flu Vaccine QUAD 6+ mos PF IM (Fluarix Quad PF) (Completed)    No orders of the defined types were placed in this encounter.   Follow-up: Return in about 6 months (around 08/23/2021) for Recheck.   This visit occurred during the SARS-CoV-2 public health emergency.  Safety protocols were in place, including screening questions prior to the visit, additional usage of staff PPE, and extensive cleaning of exam room while observing appropriate contact time as indicated for disinfecting solutions.   Romilda Garret, NP

## 2021-02-22 NOTE — Patient Instructions (Addendum)
Nice to see you WIll see you in 6 months, sooner if knee dosent improve.  Go get the xray between 8am-430pm. Just walk in  -[id number] [place] [facility])

## 2021-02-22 NOTE — Assessment & Plan Note (Signed)
States was evaluated by neurology in the past and cleared.  Currently on statin and aspirin.  No residuals.

## 2021-02-22 NOTE — Assessment & Plan Note (Signed)
Reviewed preventative health care with patient.  Up-to-date on colonoscopy, vaccines, PSA.  Administer flu vaccine in office today patient does need to have an eye exam.

## 2021-02-27 ENCOUNTER — Telehealth: Payer: Self-pay

## 2021-02-27 NOTE — Telephone Encounter (Signed)
Spoke with patient today to follow up on right knee pain and if he was able to get Voltaren gel OTC, received PA request from pharmacy for Voltaren. Patient states he went ahead and paid out of pocket for Voltaren gel and has been using it daily. He is also taking Naproxen 220 mg 2 tablets daily. Patient states swelling around right knee is there in the morning but improved with movement, pain is still there. Patient went ahead and schedule appointment with Dr Lorelei Pont to follow up on this.  FYI to [employer]

## 2021-03-09 ENCOUNTER — Ambulatory Visit: Payer: BC Managed Care – PPO | Admitting: Family Medicine

## 2021-10-05 ENCOUNTER — Encounter: Payer: Self-pay | Admitting: Nurse Practitioner

## 2021-10-05 ENCOUNTER — Ambulatory Visit: Payer: BC Managed Care – PPO | Admitting: Nurse Practitioner

## 2021-10-05 ENCOUNTER — Telehealth: Payer: Self-pay

## 2021-10-05 VITALS — BP 128/86 | HR 85 | Temp 97.3°F | Resp 14 | Wt 196.0 lb

## 2021-10-05 DIAGNOSIS — H60391 Other infective otitis externa, right ear: Secondary | ICD-10-CM | POA: Diagnosis not present

## 2021-10-05 DIAGNOSIS — H6090 Unspecified otitis externa, unspecified ear: Secondary | ICD-10-CM | POA: Insufficient documentation

## 2021-10-05 MED ORDER — CIPROFLOXACIN-DEXAMETHASONE 0.3-0.1 % OT SUSP: 4.0000 [drp] | Freq: Two times a day (BID) | OTIC | 0 refills | Status: DC

## 2021-10-05 NOTE — Telephone Encounter (Signed)
[facility] Night - Client Nonclinical Telephone Record  [facility] Client [facility] Night - Client Client Site [place] Provider Romilda Garret- NP Contact Type Call Who Is Calling Patient / Member / Family / Caregiver Caller Name [place] Phone Number [phone] Patient Name Aaron [name] Patient DOB Jan 27, 2071 Call Type Message Only Information Provided Reason for Call Request to Schedule Office Appointment Initial Comment Needs to make same day appt, pls call back , has pain in right ear Patient request to speak to RN No Disp. Time Disposition Final User 10/05/2021 8:05:50 AM General Information Provided Yes Donato Heinz Call Closed By: Donato Heinz Transaction Date/Time: 10/05/2021 8:03:47 AM (ET

## 2021-10-05 NOTE — Patient Instructions (Signed)
Nice to see today Use the ear drops for the next week Follow up if no improvement Schedule physical in 6 months

## 2021-10-05 NOTE — Telephone Encounter (Signed)
I spoke with pt; starting on 10/02/21 pt began with rt ear pain and slight S/T. Still has earache(pain level 6 - 7) and slight S/T and has not taken temp.pt is presently at work in [place] and scheduled appt with Romilda Garret NP 10/05/21 at 3 pm.UC & ED precautions given and pt voiced understanding. Sending note to Romilda Garret NP and Anastasiya CMA.

## 2021-10-05 NOTE — Progress Notes (Signed)
   Acute Office Visit  Subjective:     Patient ID: Aaron [name], male    DOB: 12/10/1970, 51 y.o.   MRN: [id number]  Chief Complaint  Patient presents with   Ear Pain    Both ears, started in the right ear at first on 10/02/21, a little drainage from the right ear and tenderness. Has taking some Motrin.    HPI Patient is in today for otalgia  Started on Monday night and started with the right and feels tender and is able to wick mositure out of it using a piece of tissue No Sick contacts States that he has tried motrin and it has helped some Its itches.    Review of Systems  Constitutional:  Negative for chills and fever.  HENT:  Positive for ear discharge and ear pain. Negative for congestion, sinus pain and sore throat.   Eyes:  Negative for pain, discharge and redness.  Respiratory:  Negative for cough and shortness of breath.        Objective:    BP 128/86   Pulse 85   Temp (!) 97.3 F (36.3 C)   Resp 14   Wt 196 lb (88.9 kg)   SpO2 97%   BMI 27.73 kg/m    Physical Exam Vitals and nursing note reviewed.  Constitutional:      Appearance: Normal appearance.  HENT:     Right Ear: Hearing, tympanic membrane and external ear normal. Drainage and tenderness present.     Left Ear: Hearing, tympanic membrane, ear canal and external ear normal.     Ears:     Comments: Patient currently discharged to the right auditory canal.  Tenderness with tugging of auricle and pressing the tragus.    Mouth/Throat:     Mouth: Mucous membranes are moist.     Pharynx: Oropharynx is clear.  Cardiovascular:     Rate and Rhythm: Normal rate and regular rhythm.     Heart sounds: Normal heart sounds.  Pulmonary:     Effort: Pulmonary effort is normal.     Breath sounds: Normal breath sounds.  Lymphadenopathy:     Cervical: No cervical adenopathy.  Neurological:     Mental Status: He is alert.    No results found for any visits on 10/05/21.      Assessment & Plan:   Problem  List Items Addressed This Visit       Nervous and Auditory   Otitis externa - Primary    Patient signs symptoms and exam distant with otitis externa.  We will treat patient with Cipro Dex eardrops times into right ear 2 times a day for 1 week.  TM was intact on examination follow-up if no improvement       Relevant Medications   ciprofloxacin-dexamethasone (CIPRODEX) OTIC suspension    Meds ordered this encounter  Medications   ciprofloxacin-dexamethasone (CIPRODEX) OTIC suspension    Sig: Place 4 drops into the right ear 2 (two) times daily. For a week    Dispense:  7.5 mL    Refill:  0    Order Specific Question:   Supervising Provider    Answer:   TOWER, MARNE A [[id number]]    No follow-ups on file.  Romilda Garret, NP

## 2021-10-05 NOTE — Telephone Encounter (Signed)
Noted. Will evaluate in office  

## 2021-10-05 NOTE — Assessment & Plan Note (Signed)
Patient signs symptoms and exam distant with otitis externa.  We will treat patient with Cipro Dex eardrops times into right ear 2 times a day for 1 week.  TM was intact on examination follow-up if no improvement

## 2021-12-25 ENCOUNTER — Ambulatory Visit: Payer: BC Managed Care – PPO | Admitting: Nurse Practitioner

## 2021-12-25 ENCOUNTER — Encounter: Payer: Self-pay | Admitting: Nurse Practitioner

## 2021-12-25 VITALS — BP 126/88 | HR 75 | Temp 97.5°F | Resp 14 | Ht 70.5 in | Wt 202.5 lb

## 2021-12-25 DIAGNOSIS — J029 Acute pharyngitis, unspecified: Secondary | ICD-10-CM | POA: Diagnosis not present

## 2021-12-25 DIAGNOSIS — I639 Cerebral infarction, unspecified: Secondary | ICD-10-CM | POA: Diagnosis not present

## 2021-12-25 DIAGNOSIS — J020 Streptococcal pharyngitis: Secondary | ICD-10-CM | POA: Insufficient documentation

## 2021-12-25 DIAGNOSIS — E7800 Pure hypercholesterolemia, unspecified: Secondary | ICD-10-CM | POA: Diagnosis not present

## 2021-12-25 LAB — POCT RAPID STREP A (OFFICE): Rapid Strep A Screen: POSITIVE — AB

## 2021-12-25 MED ORDER — AMOXICILLIN 500 MG PO CAPS: 500.0000 mg | ORAL_CAPSULE | Freq: Two times a day (BID) | ORAL | 0 refills | Status: AC

## 2021-12-25 MED ORDER — ATORVASTATIN CALCIUM 20 MG PO TABS: 20.0000 mg | ORAL_TABLET | Freq: Every day | ORAL | 3 refills | Status: DC

## 2021-12-25 NOTE — Assessment & Plan Note (Signed)
Strep test positive in office.  Did recommend patient use Tylenol and ibuprofen over-the-counter every 4 hours switching agents as needed for sore throat.  States is having difficulty swallowing so recommended he can pick up the liquid versions.  We will start patient on amoxicillin 500 mg twice daily for 10 days educated to finish the entire course of antibiotics even if he feels better prior.  We will write him out for 1 day from work until he gets the first dose of antibiotics then.  Did recommend patient to take the 1000 mg at 1 time today and then start twice daily dosing thereafter.  Follow-up if no improvement

## 2021-12-25 NOTE — Progress Notes (Signed)
Established Patient Office Visit  Subjective   Patient ID: Windell Musson, male    DOB: [birth year]  Age: 51 y.o. MRN: [id number]  Chief Complaint  Patient presents with   Sore Throat    X 2 days, hurts to swallow.       Sore throat:  All of a sudden 2-3 days ago. Last night hurt to swallow with dinner. Hurts to swallow period. No sick contacts. Covid vaccine utd No covid test No OTC medicaiton.  Has done hot tea and salt water gargle, not great relief with these modalities.  States feels like salt water gargle made it worse    Review of Systems  Constitutional:  Negative for chills, fever and malaise/fatigue.  HENT:  Positive for congestion and sore throat. Negative for ear discharge, ear pain and sinus pain.   Respiratory:  Negative for cough and shortness of breath.   Cardiovascular:  Negative for chest pain.  Musculoskeletal:  Negative for joint pain and myalgias.  Neurological:  Negative for headaches.      Objective:     BP 126/88   Pulse 75   Temp (!) 97.5 F (36.4 C)   Resp 14   Ht 5' 10.5" (1.791 m)   Wt 202 lb 8 oz (91.9 kg)   SpO2 98%   BMI 28.65 kg/m    Physical Exam Vitals and nursing note reviewed.  Constitutional:      Appearance: He is well-developed.  HENT:     Right Ear: Tympanic membrane, ear canal and external ear normal.     Left Ear: Tympanic membrane, ear canal and external ear normal.     Mouth/Throat:     Mouth: Mucous membranes are moist.     Pharynx: Oropharynx is clear. Posterior oropharyngeal erythema present.  Cardiovascular:     Rate and Rhythm: Normal rate and regular rhythm.     Heart sounds: Normal heart sounds.  Pulmonary:     Effort: Pulmonary effort is normal.     Breath sounds: Normal breath sounds.  Lymphadenopathy:     Cervical: Cervical adenopathy present.  Neurological:     Mental Status: He is alert.      Results for orders placed or performed in visit on 12/25/21  Rapid Strep A  Result Value Ref Range    Rapid Strep A Screen Positive (A) Negative      The ASCVD Risk score (Arnett DK, et al., 2019) failed to calculate for the following reasons:   The patient has a prior MI or stroke diagnosis    Assessment & Plan:   Problem List Items Addressed This Visit       Cardiovascular and Mediastinum   Stroke [facility])   Relevant Medications   atorvastatin (LIPITOR) 20 MG tablet     Respiratory   Strep throat    Strep test positive in office.  Did recommend patient use Tylenol and ibuprofen over-the-counter every 4 hours switching agents as needed for sore throat.  States is having difficulty swallowing so recommended he can pick up the liquid versions.  We will start patient on amoxicillin 500 mg twice daily for 10 days educated to finish the entire course of antibiotics even if he feels better prior.  We will write him out for 1 day from work until he gets the first dose of antibiotics then.  Did recommend patient to take the 1000 mg at 1 time today and then start twice daily dosing thereafter.  Follow-up if no improvement  Relevant Medications   amoxicillin (AMOXIL) 500 MG capsule     Other   Sore throat - Primary    Approximate 2 to 3 days ago patient has been trying at home remedies without great relief.  Strep test positive in office      Relevant Orders   Rapid Strep A (Completed)   Other Visit Diagnoses     Elevated LDL cholesterol level       Relevant Medications   atorvastatin (LIPITOR) 20 MG tablet       Return in about 3 months (around 03/27/2022) for CPE and labs.    Romilda Garret, NP

## 2021-12-25 NOTE — Patient Instructions (Signed)
Nice to see you today You can get liquid tylenol and ibuprofen to aid in the sore throat You can get over the counter throat lozenges and numbing spray to give brief relief. I have sent in antibiotics you can take 2 of the '500mg'$  capsules at one time today then take 1 capsule twice a day thereafter until you finish the entire course  I want to see you in 2-3 months for you physical, sooner if you need me

## 2021-12-25 NOTE — Assessment & Plan Note (Signed)
Approximate 2 to 3 days ago patient has been trying at home remedies without great relief.  Strep test positive in office

## 2023-03-15 ENCOUNTER — Encounter: Payer: Self-pay | Admitting: Nurse Practitioner

## 2023-03-15 ENCOUNTER — Ambulatory Visit (INDEPENDENT_AMBULATORY_CARE_PROVIDER_SITE_OTHER): Payer: BC Managed Care – PPO | Admitting: Nurse Practitioner

## 2023-03-15 VITALS — BP 120/84 | HR 60 | Temp 97.8°F | Ht 70.5 in | Wt 206.0 lb

## 2023-03-15 DIAGNOSIS — E663 Overweight: Secondary | ICD-10-CM | POA: Insufficient documentation

## 2023-03-15 DIAGNOSIS — Z1159 Encounter for screening for other viral diseases: Secondary | ICD-10-CM

## 2023-03-15 DIAGNOSIS — R7303 Prediabetes: Secondary | ICD-10-CM | POA: Insufficient documentation

## 2023-03-15 DIAGNOSIS — Z8673 Personal history of transient ischemic attack (TIA), and cerebral infarction without residual deficits: Secondary | ICD-10-CM | POA: Insufficient documentation

## 2023-03-15 DIAGNOSIS — Z125 Encounter for screening for malignant neoplasm of prostate: Secondary | ICD-10-CM | POA: Diagnosis not present

## 2023-03-15 DIAGNOSIS — Q2112 Patent foramen ovale: Secondary | ICD-10-CM | POA: Diagnosis not present

## 2023-03-15 DIAGNOSIS — Z23 Encounter for immunization: Secondary | ICD-10-CM | POA: Diagnosis not present

## 2023-03-15 DIAGNOSIS — Z0000 Encounter for general adult medical examination without abnormal findings: Secondary | ICD-10-CM

## 2023-03-15 NOTE — Patient Instructions (Signed)
Nice to see you today I will be in touch with the labs once I have them Follow up with me in 1 year, sooner if you need me Make a nurse visit for 3 months for you second and final shingles vaccine

## 2023-03-15 NOTE — Progress Notes (Signed)
Established Patient Office Visit  Subjective   Patient ID: Aaron [name], male    DOB: [birth year]  Age: 52 y.o. MRN: [id number]  Chief Complaint  Patient presents with   Annual Exam    Wants flu shot    HPI  CVA: History of same resulting from a PFO that he has since and repair.  Patient is on statin therapy  PFO: Found in 2017 during hospitalization.  Was discussed with [facility]ology and they do not favor PFO closure.  Patient declined PFO closure and RESPECT ESUS trial through [facility]  for complete physical and follow up of chronic conditions.  Immunizations: -Tetanus: Completed in 2019 -Influenza: Update today -Shingles: update  -Pneumonia: Too young  Diet: Fair diet. He will eat 3 meals a day with some snacks in between. Drinks coffee, tea, Exercise: No regular exercise. Will walk on occasion. Prior to covid he was going to the gym   Eye exam: Needs updating  Dental exam: Completes semi-annually    Colonoscopy: Completed in 07/15/2018, repeat in 7 years due in 2027 Lung Cancer Screening: N/A  PSA: Due  Sleep: will go to bed around 1130 and will get up around 6. Feels rested. Does snoer       Review of Systems  Constitutional:  Negative for chills and fever.  Respiratory:  Negative for shortness of breath.   Cardiovascular:  Negative for chest pain and leg swelling.  Gastrointestinal:  Negative for abdominal pain, blood in stool, constipation, diarrhea, nausea and vomiting.       BM daily   Genitourinary:  Negative for dysuria and hematuria.  Neurological:  Negative for tingling and headaches.  Psychiatric/Behavioral:  Negative for hallucinations and suicidal ideas.       Objective:     BP 120/84   Pulse 60   Temp 97.8 F (36.6 C) (Oral)   Ht 5' 10.5" (1.791 m)   Wt 206 lb (93.4 kg)   SpO2 98%   BMI 29.14 kg/m  BP Readings from Last 3 Encounters:  03/15/23 120/84  12/25/21 126/88  10/05/21 128/86   Wt Readings from Last 3 Encounters:  03/15/23  206 lb (93.4 kg)  12/25/21 202 lb 8 oz (91.9 kg)  10/05/21 196 lb (88.9 kg)   SpO2 Readings from Last 3 Encounters:  03/15/23 98%  12/25/21 98%  10/05/21 97%      Physical Exam Vitals and nursing note reviewed.  Constitutional:      Appearance: Normal appearance.  HENT:     Right Ear: Tympanic membrane, ear canal and external ear normal.     Left Ear: Tympanic membrane, ear canal and external ear normal.     Mouth/Throat:     Mouth: Mucous membranes are moist.     Pharynx: Oropharynx is clear.  Eyes:     Extraocular Movements: Extraocular movements intact.     Pupils: Pupils are equal, round, and reactive to light.  Cardiovascular:     Rate and Rhythm: Normal rate and regular rhythm.     Pulses: Normal pulses.     Heart sounds: Normal heart sounds.  Pulmonary:     Effort: Pulmonary effort is normal.     Breath sounds: Normal breath sounds.  Abdominal:     General: Bowel sounds are normal. There is no distension.     Palpations: There is no mass.     Tenderness: There is no abdominal tenderness.     Hernia: No hernia is present.  Musculoskeletal:  Right lower leg: No edema.     Left lower leg: No edema.  Lymphadenopathy:     Cervical: No cervical adenopathy.  Skin:    General: Skin is warm.  Neurological:     General: No focal deficit present.     Mental Status: He is alert.     Deep Tendon Reflexes:     Reflex Scores:      Bicep reflexes are 2+ on the right side and 2+ on the left side.      Patellar reflexes are 2+ on the right side and 2+ on the left side.    Comments: Bilateral upper and lower extremity strength 5/5  Psychiatric:        Mood and Affect: Mood normal.        Behavior: Behavior normal.        Thought Content: Thought content normal.        Judgment: Judgment normal.      No results found for any visits on 03/15/23.    The ASCVD Risk score (Arnett DK, et al., 2019) failed to calculate for the following reasons:   The patient has a  prior MI or stroke diagnosis    Assessment & Plan:   Problem List Items Addressed This Visit       Cardiovascular and Mediastinum   PFO (patent foramen ovale)    History of the same did confer with [facility] patient declined PFO closure or other trials.  Patient has no recurrent stroke symptoms        Other   Preventative health care - Primary    Discussed age-appropriate immunization screening exams.  Patient up-to-date on all age-appropriate vaccines we will update flu and for shingles vaccine today.  Did review patient's personal, surgical, social, family history.  Patient is up-to-date on CRC screening will do PSA for prostate cancer screening today.  Patient given information at discharge in regards to anticipatory guidance and preventative health maintenance      Relevant Orders   CBC   Comprehensive metabolic panel   TSH   Screening for prostate cancer   Relevant Orders   PSA   Need for influenza vaccination   Relevant Orders   Flu vaccine trivalent PF, 6mos and older(Flulaval,Afluria,Fluarix,Fluzone) (Completed)   Prediabetes    History of same has gained some weight since last year pending A1c today      Relevant Orders   Hemoglobin A1c   Lipid panel   History of stroke    History of the same.  Patient had PFO without closure currently on atorvastatin.  Neurological exam benign and he has been seen and cleared by neurology.      Relevant Orders   Lipid panel   Overweight   Relevant Orders   Lipid panel   Other Visit Diagnoses     Encounter for hepatitis C screening test for low risk patient       Relevant Orders   Hepatitis C Antibody   Need for shingles vaccine       Relevant Orders   Zoster Recombinant (Shingrix ) (Completed)       Return in about 1 year (around 03/14/2024) for CPE and Labs.    Audria Nine, NP

## 2023-03-15 NOTE — Assessment & Plan Note (Signed)
History of the same.  Patient had PFO without closure currently on atorvastatin.  Neurological exam benign and he has been seen and cleared by neurology.

## 2023-03-15 NOTE — Assessment & Plan Note (Signed)
Discussed age-appropriate immunization screening exams.  Patient up-to-date on all age-appropriate vaccines we will update flu and for shingles vaccine today.  Did review patient's personal, surgical, social, family history.  Patient is up-to-date on CRC screening will do PSA for prostate cancer screening today.  Patient given information at discharge in regards to anticipatory guidance and preventative health maintenance

## 2023-03-15 NOTE — Assessment & Plan Note (Signed)
History of same has gained some weight since last year pending A1c today

## 2023-03-15 NOTE — Assessment & Plan Note (Signed)
History of the same did confer with [facility] patient declined PFO closure or other trials.  Patient has no recurrent stroke symptoms

## 2023-03-16 LAB — CBC
HCT: 43.2 % (ref 38.5–50.0)
Hemoglobin: 14.3 g/dL (ref 13.2–17.1)
MCH: 28 pg (ref 27.0–33.0)
MCHC: 33.1 g/dL (ref 32.0–36.0)
MCV: 84.5 fL (ref 80.0–100.0)
MPV: 10.5 fL (ref 7.5–12.5)
Platelets: 285 10*3/uL (ref 140–400)
RBC: 5.11 10*6/uL (ref 4.20–5.80)
RDW: 12.6 % (ref 11.0–15.0)
WBC: 5.5 10*3/uL (ref 3.8–10.8)

## 2023-03-16 LAB — COMPREHENSIVE METABOLIC PANEL
AG Ratio: 1.4 (calc) (ref 1.0–2.5)
ALT: 16 U/L (ref 9–46)
AST: 19 U/L (ref 10–35)
Albumin: 4.3 g/dL (ref 3.6–5.1)
Alkaline phosphatase (APISO): 85 U/L (ref 35–144)
BUN: 16 mg/dL (ref 7–25)
CO2: 26 mmol/L (ref 20–32)
Calcium: 9.6 mg/dL (ref 8.6–10.3)
Chloride: 105 mmol/L (ref 98–110)
Creat: 1.04 mg/dL (ref 0.70–1.30)
Globulin: 3 g/dL (ref 1.9–3.7)
Glucose, Bld: 96 mg/dL (ref 65–99)
Potassium: 4.3 mmol/L (ref 3.5–5.3)
Sodium: 139 mmol/L (ref 135–146)
Total Bilirubin: 1 mg/dL (ref 0.2–1.2)
Total Protein: 7.3 g/dL (ref 6.1–8.1)

## 2023-03-16 LAB — LIPID PANEL
Cholesterol: 129 mg/dL (ref <200)
HDL: 46 mg/dL (ref >=40)
LDL Cholesterol (Calc): 70 mg/dL
Non-HDL Cholesterol (Calc): 83 mg/dL (ref <130)
Total CHOL/HDL Ratio: 2.8 (calc) (ref <5.0)
Triglycerides: 54 mg/dL (ref <150)

## 2023-03-16 LAB — PSA: PSA: 1.58 ng/mL (ref <=4.00)

## 2023-03-16 LAB — HEMOGLOBIN A1C
Hgb A1c MFr Bld: 6.2 %{Hb} — ABNORMAL HIGH (ref <5.7)
Mean Plasma Glucose: 131 mg/dL
eAG (mmol/L): 7.3 mmol/L

## 2023-03-16 LAB — TSH: TSH: 0.84 m[IU]/L (ref 0.40–4.50)

## 2023-03-16 LAB — HEPATITIS C ANTIBODY: Hepatitis C Ab: NONREACTIVE

## 2023-05-19 DIAGNOSIS — M62830 Muscle spasm of back: Secondary | ICD-10-CM | POA: Diagnosis not present

## 2024-04-03 ENCOUNTER — Telehealth: Payer: Self-pay | Admitting: Nurse Practitioner

## 2024-04-03 ENCOUNTER — Ambulatory Visit: Payer: Self-pay

## 2024-04-03 ENCOUNTER — Other Ambulatory Visit: Payer: Self-pay | Admitting: Nurse Practitioner

## 2024-04-03 DIAGNOSIS — E7800 Pure hypercholesterolemia, unspecified: Secondary | ICD-10-CM

## 2024-04-03 DIAGNOSIS — I639 Cerebral infarction, unspecified: Secondary | ICD-10-CM

## 2024-04-03 NOTE — Telephone Encounter (Signed)
 Last shingles vaccine was on 03/13/24. The earliest he could have the second vaccine is 2 months from the first one. This is okay to schedule per [facility].  Spoke with pt and he is aware of the above information. Nurse visit has been scheduled for 05/19/24 at 0900.

## 2024-04-03 NOTE — Telephone Encounter (Signed)
 Copied from CRM #[id number]. Topic: Clinical - Request for Lab/Test Order >> Apr 03, 2024  2:32 PM Terri G wrote: Reason for CRM: Patient is requesting his 2nd shingles shot. Callback number [phone]

## 2024-04-03 NOTE — Telephone Encounter (Signed)
 FYI Only or Action Required?: FYI only for provider: appointment scheduled on 04/07/2024 at 9:40 AM.  Patient was last seen in primary care on 03/15/2023 by Wendee Lynwood HERO, NP.  Called Nurse Triage reporting Gastroesophageal Reflux.  Symptoms began several weeks ago.  Interventions attempted: Rest, hydration, or home remedies.  Symptoms are: unchanged.  Triage Disposition: See PCP Within 2 Weeks  Patient/caregiver understands and will follow disposition?: Yes   Copied from CRM #[id number]. Topic: Clinical - Medication Question >> Apr 03, 2024  2:20 PM Tysheama G wrote: Reason for CRM: Patient is wanting to be put back on omeprazole  20MG ., not sure if Dr.Cable will want to see him first before he prescribe it to him again since it was an old doctor. Reason for Disposition  [1] Abdominal pain is intermittent AND [2] shoots into chest, with sour taste in mouth  (Exception: Symptoms same as previously diagnosed reflux and not tried antacids.)  Answer Assessment - Initial Assessment Questions Patient calling with concerns for upper abdominal pain that started a few weeks ago. Patient states he thinks his ulcer has come back and is wanting to go back on omprezole. Denies CP, SOB. Endorses bad taste in mouth and pain after eating more acid based foods like coffee or spaghetti and red sauce. Patient is scheduled to see another provider in PCP office on 04/07/2024 at 9:40 AM.   1. LOCATION: Where does it hurt?      Upper abdomen 2. RADIATION: Does the pain shoot anywhere else? (e.g., chest, back)     No radiation 3. ONSET: When did the pain begin? (e.g., minutes, hours or days ago)      Started a few weeks. 4. SUDDEN: Gradual or sudden onset?     gradual 5. PATTERN Does the pain come and go, or is it constant?     Intermittent-depending on what he eats 6. SEVERITY: How bad is the pain?  (e.g., Scale 1-10; mild, moderate, or severe)     No pain currently 7. RECURRENT SYMPTOM:  Have you ever had this type of stomach pain before? If Yes, ask: When was the last time? and What happened that time?      yes 8. AGGRAVATING FACTORS: Does anything seem to cause this pain? (e.g., foods, stress, alcohol)     Patient is concerned that his stomach ulcer has come back 9. CARDIAC SYMPTOMS: Do you have any of the following symptoms: chest pain, difficulty breathing, sweating, nausea?     no 10. OTHER SYMPTOMS: Do you have any other symptoms? (e.g., back pain, diarrhea, fever, urination pain, vomiting)       Patient states he is having a metal smell coming from his mouth. States he can have a bad taste in his mouth.  Protocols used: Abdominal Pain - Upper-A-AH

## 2024-04-03 NOTE — Telephone Encounter (Unsigned)
 Copied from CRM #[id number]. Topic: Clinical - Medication Refill >> Apr 03, 2024  2:22 PM Tysheama G wrote: Medication: atorvastatin  (LIPITOR) 20 MG tablet    Has the patient contacted their pharmacy? No (Agent: If no, request that the patient contact the pharmacy for the refill. If patient does not wish to contact the pharmacy document the reason why and proceed with request.) (Agent: If yes, when and what did the pharmacy advise?)  This is the patient's preferred pharmacy:  [facility] [phone], [place] - [address] [place] [address] Phone: [phone] Fax: [phone]  Is this the correct pharmacy for this prescription? Yes If no, delete pharmacy and type the correct one.   Has the prescription been filled recently? No  Is the patient out of the medication? Yes  Has the patient been seen for an appointment in the last year OR does the patient have an upcoming appointment? Yes  Can we respond through MyChart? Yes  Agent: Please be advised that Rx refills may take up to 3 business days. We ask that you follow-up with your pharmacy.

## 2024-04-03 NOTE — Telephone Encounter (Signed)
 Pt is scheduled with Dr. Avelina on 04/07/2024

## 2024-04-06 ENCOUNTER — Telehealth: Payer: Self-pay

## 2024-04-06 MED ORDER — ATORVASTATIN CALCIUM 20 MG PO TABS: 20.0000 mg | ORAL_TABLET | Freq: Every day | ORAL | 0 refills | Status: AC

## 2024-04-06 NOTE — Telephone Encounter (Signed)
 Copied from CRM #[id number]. Topic: Clinical - Medication Question >> Apr 06, 2024  2:03 PM Aaron [name] wrote: Reason for CRM: Patient was advised to schedule [name] for further refills on his atorvastatin  medication.Patient is scheduled for providers next available which is in January.He would like to know if he could receive a refill to hold him over until then?  [name] [name], [name] - [name]  Phone: [phone] Fax: [phone]

## 2024-04-06 NOTE — Telephone Encounter (Signed)
 lvm for pt to call office to schedule appt.

## 2024-04-06 NOTE — Telephone Encounter (Signed)
 Needs a CPE scheduled with me within the next 90 days to continue getting refills

## 2024-04-06 NOTE — Telephone Encounter (Signed)
 Noted. Since he is having abdominal pain it is best to be evaluated prior to giving further advice

## 2024-04-07 ENCOUNTER — Ambulatory Visit: Admitting: Family Medicine

## 2024-04-07 ENCOUNTER — Ambulatory Visit: Payer: Self-pay | Admitting: Family Medicine

## 2024-04-07 VITALS — BP 110/80 | HR 61 | Temp 97.5°F | Ht 69.5 in | Wt 205.0 lb

## 2024-04-07 DIAGNOSIS — R1013 Epigastric pain: Secondary | ICD-10-CM

## 2024-04-07 DIAGNOSIS — Z23 Encounter for immunization: Secondary | ICD-10-CM | POA: Diagnosis not present

## 2024-04-07 LAB — CBC WITH DIFFERENTIAL/PLATELET
Basophils Absolute: 0 K/uL (ref 0.0–0.1)
Basophils Relative: 0.7 % (ref 0.0–3.0)
Eosinophils Absolute: 0.1 K/uL (ref 0.0–0.7)
Eosinophils Relative: 2 % (ref 0.0–5.0)
HCT: 44 % (ref 39.0–52.0)
Hemoglobin: 14.6 g/dL (ref 13.0–17.0)
Lymphocytes Relative: 26.8 % (ref 12.0–46.0)
Lymphs Abs: 1.4 K/uL (ref 0.7–4.0)
MCHC: 33.3 g/dL (ref 30.0–36.0)
MCV: 85.4 fl (ref 78.0–100.0)
Monocytes Absolute: 0.5 K/uL (ref 0.1–1.0)
Monocytes Relative: 9.2 % (ref 3.0–12.0)
Neutro Abs: 3.3 K/uL (ref 1.4–7.7)
Neutrophils Relative %: 61.3 % (ref 43.0–77.0)
Platelets: 266 K/uL (ref 150.0–400.0)
RBC: 5.16 Mil/uL (ref 4.22–5.81)
RDW: 13.6 % (ref 11.5–15.5)
WBC: 5.4 K/uL (ref 4.0–10.5)

## 2024-04-07 LAB — COMPREHENSIVE METABOLIC PANEL WITH GFR
ALT: 15 U/L (ref 0–53)
AST: 19 U/L (ref 0–37)
Albumin: 4.3 g/dL (ref 3.5–5.2)
Alkaline Phosphatase: 83 U/L (ref 39–117)
BUN: 14 mg/dL (ref 6–23)
CO2: 27 meq/L (ref 19–32)
Calcium: 9.1 mg/dL (ref 8.4–10.5)
Chloride: 107 meq/L (ref 96–112)
Creatinine, Ser: 1.11 mg/dL (ref 0.40–1.50)
GFR: 74.73 mL/min (ref >60.00)
Glucose, Bld: 98 mg/dL (ref 70–99)
Potassium: 4.1 meq/L (ref 3.5–5.1)
Sodium: 140 meq/L (ref 135–145)
Total Bilirubin: 0.8 mg/dL (ref 0.2–1.2)
Total Protein: 7.4 g/dL (ref 6.0–8.3)

## 2024-04-07 LAB — LIPASE: Lipase: 10 U/L — ABNORMAL LOW (ref 11.0–59.0)

## 2024-04-07 NOTE — Telephone Encounter (Signed)
 Yes. I sent in 90 days worth of the medication yesterday

## 2024-04-07 NOTE — Assessment & Plan Note (Signed)
 Acute, symptoms most consistent with gastritis/GERD.  Differential also includes pancreatitis, peptic ulcer disease and gallbladder disease. Will evaluate with labs including c-Met, CBC and lipase. Recommended avoidance of acidic foods and lifestyle changes including 10% weight loss, eating smaller meals and larger meal earlier in the day. Start Prilosec 20-40 mg p.o. daily x 4 to 6 weeks while working on low acid diet. If not improving as expected let me know for further recommendations.  Return and ER precautions provided.

## 2024-04-07 NOTE — Progress Notes (Signed)
 Keep   Patient ID: Aaron [name], male    DOB: 18-Jan-1971, 53 y.o.   MRN: [id number]  This visit was conducted in person.  BP 110/80   Pulse 61   Temp (!) 97.5 F (36.4 C) (Temporal)   Ht 5' 9.5 (1.765 m)   Wt 205 lb (93 kg)   SpO2 98%   BMI 29.84 kg/m    CC:  Chief Complaint  Patient presents with   Abdominal Pain   Metal Taste in Mouth    After drinking coffee or acidic foods    Subjective:   HPI: Aaron [name] is a 53 y.o. male presenting on 04/07/2024 for Abdominal Pain and Metal Taste in Mouth (After drinking coffee or acidic foods) Patient of Adina Crandall, NP with history of CVA, patent foramen ovale  He presents today with new onset abdominal pain in last 1-2 months.  Pain in epigastrium.. notes after eating multiple cups of coffee or spaghetti. Pain lasts 0 min. No radiation of pain.  No N/V/D/C.  Occ acid taste in throat.  Some better with Kefir, bananas.  Occurring daily to every few days.  Noted some improvement with decreasing triggers.   He has tried antacid.SABRA PEPCID AC.  He has also noted metal taste in his mouth after drinking coffee or acidic foods.         Relevant past medical, surgical, family and social history reviewed and updated as indicated. Interim medical history since our last visit reviewed. Allergies and medications reviewed and updated. Outpatient Medications Prior to Visit  Medication Sig Dispense Refill   atorvastatin  (LIPITOR) 20 MG tablet Take 1 tablet (20 mg total) by mouth daily. NEED OFFICE VISIT FOR FURTHER REFILLS 90 tablet 0   No facility-administered medications prior to visit.     Per HPI unless specifically indicated in ROS section below Review of Systems  Constitutional:  Negative for fatigue and fever.  HENT:  Negative for ear pain.   Eyes:  Negative for pain.  Respiratory:  Negative for cough and shortness of breath.   Cardiovascular:  Negative for chest pain, palpitations and leg swelling.  Gastrointestinal:   Negative for abdominal pain.  Genitourinary:  Negative for dysuria.  Musculoskeletal:  Negative for arthralgias.  Neurological:  Negative for syncope, light-headedness and headaches.  Psychiatric/Behavioral:  Negative for dysphoric mood.    Objective:  BP 110/80   Pulse 61   Temp (!) 97.5 F (36.4 C) (Temporal)   Ht 5' 9.5 (1.765 m)   Wt 205 lb (93 kg)   SpO2 98%   BMI 29.84 kg/m   Wt Readings from Last 3 Encounters:  04/07/24 205 lb (93 kg)  03/15/23 206 lb (93.4 kg)  12/25/21 202 lb 8 oz (91.9 kg)      Physical Exam Constitutional:      Appearance: He is well-developed.  HENT:     Head: Normocephalic.     Right Ear: Hearing normal.     Left Ear: Hearing normal.     Nose: Nose normal.  Neck:     Thyroid : No thyroid  mass or thyromegaly.     Vascular: No carotid bruit.     Trachea: Trachea normal.  Cardiovascular:     Rate and Rhythm: Normal rate and regular rhythm.     Pulses: Normal pulses.     Heart sounds: Heart sounds not distant. No murmur heard.    No friction rub. No gallop.     Comments: No peripheral edema Pulmonary:  Effort: Pulmonary effort is normal. No respiratory distress.     Breath sounds: Normal breath sounds.  Abdominal:     General: Abdomen is protuberant. Bowel sounds are normal.     Palpations: There is no shifting dullness, fluid wave, hepatomegaly, splenomegaly, mass or pulsatile mass.     Tenderness: There is no abdominal tenderness. There is no right CVA tenderness, left CVA tenderness, guarding or rebound.  Skin:    General: Skin is warm and dry.     Findings: No rash.  Psychiatric:        Speech: Speech normal.        Behavior: Behavior normal.        Thought Content: Thought content normal.       Results for orders placed or performed in visit on 03/15/23  Hepatitis C Antibody   Collection Time: 03/15/23  2:35 PM  Result Value Ref Range   Hepatitis C Ab NON-REACTIVE NON-REACTIVE  CBC   Collection Time: 03/15/23  2:35 PM   Result Value Ref Range   WBC 5.5 3.8 - 10.8 Thousand/uL   RBC 5.11 4.20 - 5.80 Million/uL   Hemoglobin 14.3 13.2 - 17.1 g/dL   HCT 56.7 61.4 - 49.9 %   MCV 84.5 80.0 - 100.0 fL   MCH 28.0 27.0 - 33.0 pg   MCHC 33.1 32.0 - 36.0 g/dL   RDW 87.3 88.9 - 84.9 %   Platelets 285 140 - 400 Thousand/uL   MPV 10.5 7.5 - 12.5 fL  Comprehensive metabolic panel   Collection Time: 03/15/23  2:35 PM  Result Value Ref Range   Glucose, Bld 96 65 - 99 mg/dL   BUN 16 7 - 25 mg/dL   Creat 8.95 9.29 - 8.69 mg/dL   BUN/Creatinine Ratio SEE NOTE: 6 - 22 (calc)   Sodium 139 135 - 146 mmol/L   Potassium 4.3 3.5 - 5.3 mmol/L   Chloride 105 98 - 110 mmol/L   CO2 26 20 - 32 mmol/L   Calcium  9.6 8.6 - 10.3 mg/dL   Total Protein 7.3 6.1 - 8.1 g/dL   Albumin 4.3 3.6 - 5.1 g/dL   Globulin 3.0 1.9 - 3.7 g/dL (calc)   AG Ratio 1.4 1.0 - 2.5 (calc)   Total Bilirubin 1.0 0.2 - 1.2 mg/dL   Alkaline phosphatase (APISO) 85 35 - 144 U/L   AST 19 10 - 35 U/L   ALT 16 9 - 46 U/L  Hemoglobin A1c   Collection Time: 03/15/23  2:35 PM  Result Value Ref Range   Hgb A1c MFr Bld 6.2 (H) <5.7 % of total Hgb   Mean Plasma Glucose 131 mg/dL   eAG (mmol/L) 7.3 mmol/L  TSH   Collection Time: 03/15/23  2:35 PM  Result Value Ref Range   TSH 0.84 0.40 - 4.50 mIU/L  Lipid panel   Collection Time: 03/15/23  2:35 PM  Result Value Ref Range   Cholesterol 129 <200 mg/dL   HDL 46 > OR = 40 mg/dL   Triglycerides 54 <849 mg/dL   LDL Cholesterol (Calc) 70 mg/dL (calc)   Total CHOL/HDL Ratio 2.8 <5.0 (calc)   Non-HDL Cholesterol (Calc) 83 <869 mg/dL (calc)  PSA   Collection Time: 03/15/23  2:35 PM  Result Value Ref Range   PSA 1.58 < OR = 4.00 ng/mL    Assessment and Plan  Epigastric pain Assessment & Plan: Acute, symptoms most consistent with gastritis/GERD.  Differential also includes pancreatitis, peptic ulcer disease and gallbladder  disease. Will evaluate with labs including c-Met, CBC and lipase. Recommended  avoidance of acidic foods and lifestyle changes including 10% weight loss, eating smaller meals and larger meal earlier in the day. Start Prilosec 20-40 mg p.o. daily x 4 to 6 weeks while working on low acid diet. If not improving as expected let me know for further recommendations.  Return and ER precautions provided.  Orders: -     Comprehensive metabolic panel with GFR -     CBC with Differential/Platelet -     Lipase  Need for influenza vaccination -     Flu vaccine trivalent PF, 6mos and older(Flulaval,Afluria,Fluarix,Fluzone)    No follow-ups on file.   Greig Ring, MD

## 2024-05-19 ENCOUNTER — Ambulatory Visit

## 2024-05-26 ENCOUNTER — Ambulatory Visit (INDEPENDENT_AMBULATORY_CARE_PROVIDER_SITE_OTHER)

## 2024-05-26 DIAGNOSIS — Z23 Encounter for immunization: Secondary | ICD-10-CM

## 2024-05-26 NOTE — Progress Notes (Signed)
 Per orders of [employer] DPN AGNP-C, injection of shingrix  given by Laray Arenas in right deltoid. Patient tolerated injection well. This is 2nd shin grix.

## 2024-06-05 ENCOUNTER — Encounter: Admitting: Nurse Practitioner

## 2024-08-18 ENCOUNTER — Encounter: Admitting: Nurse Practitioner
